# Patient Record
Sex: Male | Born: 1950 | Race: White | Hispanic: No | Smoking: Current every day smoker
Health system: Southern US, Community
[De-identification: ages and names within clinical notes are randomized; demographics above are authoritative.]

## PROBLEM LIST (undated history)

## (undated) DIAGNOSIS — R0600 Dyspnea, unspecified: Secondary | ICD-10-CM

## (undated) DIAGNOSIS — E119 Type 2 diabetes mellitus without complications: Secondary | ICD-10-CM

## (undated) DIAGNOSIS — T7840XA Allergy, unspecified, initial encounter: Secondary | ICD-10-CM

## (undated) DIAGNOSIS — C3490 Malignant neoplasm of unspecified part of unspecified bronchus or lung: Secondary | ICD-10-CM

## (undated) HISTORY — DX: Type 2 diabetes mellitus without complications: E11.9

## (undated) HISTORY — PX: COLONOSCOPY: SHX174

## (undated) HISTORY — PX: TONSILLECTOMY: SUR1361

## (undated) HISTORY — DX: Allergy, unspecified, initial encounter: T78.40XA

## (undated) HISTORY — PX: ADENOIDECTOMY: SUR15

---

## 2007-10-10 ENCOUNTER — Ambulatory Visit: Payer: Self-pay | Admitting: Gastroenterology

## 2007-11-13 ENCOUNTER — Ambulatory Visit: Payer: Self-pay | Admitting: Gastroenterology

## 2007-11-27 ENCOUNTER — Ambulatory Visit: Payer: Self-pay | Admitting: Gastroenterology

## 2009-01-23 ENCOUNTER — Inpatient Hospital Stay: Payer: Self-pay | Admitting: Internal Medicine

## 2009-03-18 ENCOUNTER — Ambulatory Visit: Payer: Self-pay | Admitting: Family Medicine

## 2013-01-01 LAB — PSA: PSA: 2

## 2015-01-06 ENCOUNTER — Ambulatory Visit: Payer: Self-pay | Admitting: Ophthalmology

## 2015-01-06 ENCOUNTER — Ambulatory Visit: Payer: Self-pay

## 2015-01-06 LAB — LIPID PANEL
CHOLESTEROL: 187 mg/dL (ref 0–200)
HDL: 33 mg/dL — AB (ref 35–70)
LDL CALC: 123 mg/dL
Triglycerides: 157 mg/dL (ref 40–160)

## 2015-01-06 LAB — CBC AND DIFFERENTIAL
HEMATOCRIT: 42 % (ref 41–53)
HEMOGLOBIN: 14.5 g/dL (ref 13.5–17.5)
NEUTROS ABS: 4 /uL
PLATELETS: 182 10*3/uL (ref 150–399)
WBC: 9.4 10*3/mL

## 2015-01-06 LAB — TSH: TSH: 7.19 u[IU]/mL — AB (ref 0.41–5.90)

## 2015-01-06 LAB — HEPATIC FUNCTION PANEL
ALK PHOS: 91 U/L (ref 25–125)
ALT: 11 U/L (ref 10–40)
AST: 18 U/L (ref 14–40)
Bilirubin, Total: 0.4 mg/dL

## 2015-01-06 LAB — BASIC METABOLIC PANEL
BUN: 9 mg/dL (ref 4–21)
CREATININE: 1 mg/dL (ref 0.6–1.3)
Glucose: 66 mg/dL
Potassium: 4.9 mmol/L (ref 3.4–5.3)
Sodium: 143 mmol/L (ref 137–147)

## 2015-01-06 LAB — HEMOGLOBIN A1C: Hemoglobin A1C: 5.8

## 2015-01-19 ENCOUNTER — Ambulatory Visit: Payer: Self-pay | Admitting: Internal Medicine

## 2015-01-19 DIAGNOSIS — E039 Hypothyroidism, unspecified: Secondary | ICD-10-CM | POA: Insufficient documentation

## 2015-01-19 DIAGNOSIS — J3089 Other allergic rhinitis: Secondary | ICD-10-CM | POA: Insufficient documentation

## 2015-03-25 DIAGNOSIS — E039 Hypothyroidism, unspecified: Secondary | ICD-10-CM

## 2015-03-25 DIAGNOSIS — J3089 Other allergic rhinitis: Secondary | ICD-10-CM

## 2015-03-25 DIAGNOSIS — E119 Type 2 diabetes mellitus without complications: Secondary | ICD-10-CM

## 2015-03-30 ENCOUNTER — Ambulatory Visit: Payer: Self-pay

## 2015-03-30 DIAGNOSIS — E039 Hypothyroidism, unspecified: Secondary | ICD-10-CM

## 2015-03-30 DIAGNOSIS — I10 Essential (primary) hypertension: Secondary | ICD-10-CM

## 2015-03-31 ENCOUNTER — Other Ambulatory Visit: Payer: Self-pay

## 2015-03-31 LAB — COMPREHENSIVE METABOLIC PANEL
ALBUMIN: 4.6 g/dL (ref 3.6–4.8)
ALK PHOS: 92 IU/L (ref 39–117)
ALT: 9 IU/L (ref 0–44)
AST: 16 IU/L (ref 0–40)
Albumin/Globulin Ratio: 1.6 (ref 1.2–2.2)
BILIRUBIN TOTAL: 0.2 mg/dL (ref 0.0–1.2)
BUN/Creatinine Ratio: 11 (ref 10–22)
BUN: 12 mg/dL (ref 8–27)
CHLORIDE: 102 mmol/L (ref 96–106)
CO2: 21 mmol/L (ref 18–29)
Calcium: 9.8 mg/dL (ref 8.6–10.2)
Creatinine, Ser: 1.13 mg/dL (ref 0.76–1.27)
GFR calc Af Amer: 79 mL/min/{1.73_m2} (ref >59)
GFR calc non Af Amer: 68 mL/min/{1.73_m2} (ref >59)
GLOBULIN, TOTAL: 2.9 g/dL (ref 1.5–4.5)
GLUCOSE: 122 mg/dL — AB (ref 65–99)
Potassium: 6 mmol/L — ABNORMAL HIGH (ref 3.5–5.2)
Sodium: 146 mmol/L — ABNORMAL HIGH (ref 134–144)
Total Protein: 7.5 g/dL (ref 6.0–8.5)

## 2015-03-31 LAB — CBC WITH DIFFERENTIAL/PLATELET
BASOS ABS: 0 10*3/uL (ref 0.0–0.2)
Basos: 0 %
EOS (ABSOLUTE): 0.2 10*3/uL (ref 0.0–0.4)
Eos: 2 %
HEMOGLOBIN: 15.2 g/dL (ref 12.6–17.7)
Hematocrit: 45.5 % (ref 37.5–51.0)
IMMATURE GRANS (ABS): 0 10*3/uL (ref 0.0–0.1)
Immature Granulocytes: 0 %
LYMPHS: 19 %
Lymphocytes Absolute: 2.4 10*3/uL (ref 0.7–3.1)
MCH: 30.6 pg (ref 26.6–33.0)
MCHC: 33.4 g/dL (ref 31.5–35.7)
MCV: 92 fL (ref 79–97)
MONOCYTES: 8 %
Monocytes Absolute: 1 10*3/uL — ABNORMAL HIGH (ref 0.1–0.9)
Neutrophils Absolute: 9.2 10*3/uL — ABNORMAL HIGH (ref 1.4–7.0)
Neutrophils: 71 %
PLATELETS: 221 10*3/uL (ref 150–379)
RBC: 4.97 x10E6/uL (ref 4.14–5.80)
RDW: 13.8 % (ref 12.3–15.4)
WBC: 12.9 10*3/uL — AB (ref 3.4–10.8)

## 2015-03-31 LAB — T4: T4 TOTAL: 8.7 ug/dL (ref 4.5–12.0)

## 2015-03-31 LAB — HEMOGLOBIN A1C
Est. average glucose Bld gHb Est-mCnc: 126 mg/dL
HEMOGLOBIN A1C: 6 % — AB (ref 4.8–5.6)

## 2015-04-13 ENCOUNTER — Ambulatory Visit: Payer: Self-pay | Admitting: Internal Medicine

## 2015-04-13 ENCOUNTER — Encounter: Payer: Self-pay | Admitting: Internal Medicine

## 2015-04-13 VITALS — BP 109/70 | HR 66 | Temp 97.5°F | Wt 190.0 lb

## 2015-04-13 DIAGNOSIS — E119 Type 2 diabetes mellitus without complications: Secondary | ICD-10-CM

## 2015-04-13 LAB — GLUCOSE, POCT (MANUAL RESULT ENTRY): POC GLUCOSE: 95 mg/dL (ref 70–99)

## 2015-04-13 NOTE — Progress Notes (Signed)
   Subjective:    Patient ID: Austin Bryan, male    DOB: 07-20-1950, 65 y.o.   MRN: [id number]  HPI Patient Active Problem List   Diagnosis Date Noted  . Hypothyroidism 01/19/2015  . 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 Diabetes ([place]) 02/10/2009   Pt presents with a f/u for diabetes. His blood glucose this morning was 95.   Pt is having congestion and coughing due to allergies. Pt has been taking a few tablets of Claritin and it seemed to help. Pt is on Allegra.   Pt has had some acid reflux but claims that it is rare. Pt claims that he was prescribed Ranitidine with Metformin to counteract stomach problems. Pt has about 3-4 bowel movements a day (depends on what he's eaten). Pt doesn't have any indigestion problems.    Review of Systems  HENT: Positive for congestion.   Respiratory: Positive for cough.        Objective:   Physical Exam  Constitutional: He is oriented to person, place, and time.  Cardiovascular: Normal rate, regular rhythm and normal heart sounds.   Pulmonary/Chest: Effort normal and breath sounds normal.  Neurological: He is alert and oriented to person, place, and time.    BP 109/70 mmHg  Pulse 66  Temp(Src) 97.5 F (36.4 C)  Wt 190 lb (86.183 kg)  Blood glucose: 95    Medication List       This list is accurate as of: 04/13/15 11:34 AM.  Always use your most recent med list.               fexofenadine 180 MG tablet  Commonly known as:  ALLEGRA  Take 180 mg by mouth daily.     metFORMIN 500 MG tablet  Commonly known as:  GLUCOPHAGE  Take 500 mg by mouth 2 (two) times daily with a meal.     ranitidine 150 MG tablet  Commonly known as:  ZANTAC  Take 150 mg by mouth daily.          Assessment & Plan:  Discontinue Ranitidine. If GERD returns, needs to go back on this med.   Diabetes ([name]) Diabetes looks good. Glucose this morning was 95 and last A1c was 6.0. No change in meds.    Md f/u: 4 months Labs before: Met c, cbc, a1c,  vitamin B12, tsh, lipid

## 2015-04-13 NOTE — Assessment & Plan Note (Addendum)
Diabetes looks good. Glucose this morning was 95 and last A1c was 6.0. No change in meds.

## 2015-08-10 ENCOUNTER — Other Ambulatory Visit: Payer: Self-pay

## 2015-08-17 ENCOUNTER — Ambulatory Visit: Payer: Self-pay | Admitting: Internal Medicine

## 2015-08-17 ENCOUNTER — Other Ambulatory Visit: Payer: Self-pay

## 2015-08-17 DIAGNOSIS — E119 Type 2 diabetes mellitus without complications: Secondary | ICD-10-CM

## 2015-08-17 DIAGNOSIS — E039 Hypothyroidism, unspecified: Secondary | ICD-10-CM

## 2015-08-17 DIAGNOSIS — I10 Essential (primary) hypertension: Secondary | ICD-10-CM

## 2015-08-18 LAB — CBC WITH DIFFERENTIAL/PLATELET
BASOS ABS: 0.1 10*3/uL (ref 0.0–0.2)
Basos: 1 %
EOS (ABSOLUTE): 0.4 10*3/uL (ref 0.0–0.4)
Eos: 4 %
Hematocrit: 44 % (ref 37.5–51.0)
Hemoglobin: 14.7 g/dL (ref 12.6–17.7)
IMMATURE GRANS (ABS): 0 10*3/uL (ref 0.0–0.1)
Immature Granulocytes: 0 %
LYMPHS: 27 %
Lymphocytes Absolute: 2.6 10*3/uL (ref 0.7–3.1)
MCH: 30.8 pg (ref 26.6–33.0)
MCHC: 33.4 g/dL (ref 31.5–35.7)
MCV: 92 fL (ref 79–97)
Monocytes Absolute: 0.8 10*3/uL (ref 0.1–0.9)
Monocytes: 8 %
NEUTROS ABS: 5.9 10*3/uL (ref 1.4–7.0)
NEUTROS PCT: 60 %
PLATELETS: 195 10*3/uL (ref 150–379)
RBC: 4.77 x10E6/uL (ref 4.14–5.80)
RDW: 14.6 % (ref 12.3–15.4)
WBC: 9.7 10*3/uL (ref 3.4–10.8)

## 2015-08-18 LAB — COMPREHENSIVE METABOLIC PANEL
A/G RATIO: 1.7 (ref 1.2–2.2)
ALK PHOS: 84 IU/L (ref 39–117)
ALT: 12 IU/L (ref 0–44)
AST: 15 IU/L (ref 0–40)
Albumin: 4.3 g/dL (ref 3.6–4.8)
BILIRUBIN TOTAL: 0.2 mg/dL (ref 0.0–1.2)
BUN/Creatinine Ratio: 12 (ref 10–24)
BUN: 12 mg/dL (ref 8–27)
CHLORIDE: 100 mmol/L (ref 96–106)
CO2: 26 mmol/L (ref 18–29)
Calcium: 9.2 mg/dL (ref 8.6–10.2)
Creatinine, Ser: 0.98 mg/dL (ref 0.76–1.27)
GFR calc Af Amer: 94 mL/min/{1.73_m2} (ref >59)
GFR calc non Af Amer: 81 mL/min/{1.73_m2} (ref >59)
GLUCOSE: 82 mg/dL (ref 65–99)
Globulin, Total: 2.5 g/dL (ref 1.5–4.5)
POTASSIUM: 4.8 mmol/L (ref 3.5–5.2)
Sodium: 141 mmol/L (ref 134–144)
Total Protein: 6.8 g/dL (ref 6.0–8.5)

## 2015-08-18 LAB — LIPID PANEL
CHOL/HDL RATIO: 5 ratio (ref 0.0–5.0)
CHOLESTEROL TOTAL: 154 mg/dL (ref 100–199)
HDL: 31 mg/dL — ABNORMAL LOW (ref >39)
LDL CALC: 98 mg/dL (ref 0–99)
Triglycerides: 126 mg/dL (ref 0–149)
VLDL Cholesterol Cal: 25 mg/dL (ref 5–40)

## 2015-08-18 LAB — VITAMIN B12: Vitamin B-12: 203 pg/mL — ABNORMAL LOW (ref 211–946)

## 2015-08-18 LAB — HEMOGLOBIN A1C
Est. average glucose Bld gHb Est-mCnc: 114 mg/dL
HEMOGLOBIN A1C: 5.6 % (ref 4.8–5.6)

## 2015-08-18 LAB — TSH: TSH: 8.35 u[IU]/mL — AB (ref 0.450–4.500)

## 2015-08-24 ENCOUNTER — Ambulatory Visit: Payer: Self-pay | Admitting: Internal Medicine

## 2015-09-14 ENCOUNTER — Encounter: Payer: Self-pay | Admitting: Internal Medicine

## 2015-09-14 ENCOUNTER — Ambulatory Visit: Payer: Self-pay | Admitting: Internal Medicine

## 2015-09-14 VITALS — BP 117/68 | HR 82 | Temp 98.2°F | Wt 186.0 lb

## 2015-09-14 DIAGNOSIS — E119 Type 2 diabetes mellitus without complications: Secondary | ICD-10-CM

## 2015-09-14 DIAGNOSIS — R7989 Other specified abnormal findings of blood chemistry: Secondary | ICD-10-CM

## 2015-09-14 DIAGNOSIS — R490 Dysphonia: Secondary | ICD-10-CM

## 2015-09-14 LAB — GLUCOSE, POCT (MANUAL RESULT ENTRY): POC Glucose: 131 mg/dl — AB (ref 70–99)

## 2015-09-14 MED ORDER — RANITIDINE HCL 150 MG PO TABS: 150.0000 mg | ORAL_TABLET | Freq: Two times a day (BID) | ORAL | 2 refills | Status: DC

## 2015-09-14 MED ORDER — CYANOCOBALAMIN 1000 MCG/ML IJ SOLN: 1000.0000 ug | Freq: Once | INTRAMUSCULAR | 0 refills | Status: AC

## 2015-09-14 MED ORDER — METFORMIN HCL 500 MG PO TABS: 500.0000 mg | ORAL_TABLET | Freq: Two times a day (BID) | ORAL | 2 refills | Status: DC

## 2015-09-14 MED ORDER — FEXOFENADINE HCL 180 MG PO TABS: 180.0000 mg | ORAL_TABLET | Freq: Every day | ORAL | 2 refills | Status: DC

## 2015-09-14 NOTE — Progress Notes (Signed)
   Subjective:    Patient ID: Austin Bryan, male    DOB: Apr 09, 1950, 65 y.o.   MRN: [id number]  HPI  Patient Active Problem List   Diagnosis Date Noted  . Hypothyroidism 01/19/2015  . 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 Diabetes ([place]) 02/10/2009   Blood glucose is good.  Patient has lost 10 pounds.  He is hoarse and has been for few days.  B12 level is low which could be due to Metformin.  Blood pressure is normal  Review of Systems No goiter felt in neck.  Lungs sounds normal    Objective:   Physical Exam  Constitutional: He is oriented to person, place, and time.  Cardiovascular: Normal rate, regular rhythm and normal heart sounds.   Pulmonary/Chest: Effort normal and breath sounds normal.  Neurological: He is alert and oriented to person, place, and time. He has normal reflexes.    BP 117/68   Pulse 82   Temp 98.2 F (36.8 C)   Wt 186 lb (84.4 kg)    Current Outpatient Prescriptions on File Prior to Visit  Medication Sig Dispense Refill  . fexofenadine (ALLEGRA) 180 MG tablet Take 180 mg by mouth daily.    . metFORMIN (GLUCOPHAGE) 500 MG tablet Take 500 mg by mouth 2 (two) times daily with a meal.     No current facility-administered medications on file prior to visit.          Assessment & Plan:  Patient needs chest x-ray due to being a smoker and being hoase.  Ultrasound of the thyroid due to TSH level increasing and being hoarse. After blood tests completed.  1cc B12 to be administered today.  Follow up 3 months with labs: G54 level, Folic Acid, MetC, CBC, Lipid, A1c, TSH, UA, microalbumin.

## 2015-09-14 NOTE — Patient Instructions (Signed)
Labs in 3 months Y57 level, Folic Acid, MetC, CBC, Lipid, A1c, TSH, UA, microalbumin. Needs chest x-ray and ultrasound of thyroid due to hoarseness, smoker and rising TSH

## 2015-09-15 LAB — VITAMIN B12: VITAMIN B 12: 191 pg/mL — AB (ref 211–946)

## 2015-09-27 ENCOUNTER — Ambulatory Visit
Admission: RE | Admit: 2015-09-27 | Discharge: 2015-09-27 | Disposition: A | Discharge status: Home / Self Care | Payer: Medicare Other | Source: Ambulatory Visit | Attending: Internal Medicine | Admitting: Internal Medicine

## 2015-09-27 DIAGNOSIS — R7989 Other specified abnormal findings of blood chemistry: Secondary | ICD-10-CM

## 2015-09-27 DIAGNOSIS — R490 Dysphonia: Secondary | ICD-10-CM | POA: Insufficient documentation

## 2015-09-27 DIAGNOSIS — R938 Abnormal findings on diagnostic imaging of other specified body structures: Secondary | ICD-10-CM | POA: Insufficient documentation

## 2015-09-27 DIAGNOSIS — R946 Abnormal results of thyroid function studies: Secondary | ICD-10-CM | POA: Diagnosis not present

## 2015-11-07 ENCOUNTER — Ambulatory Visit: Payer: Medicare Other | Admitting: Pharmacist

## 2015-11-07 ENCOUNTER — Ambulatory Visit: Payer: Medicare Other | Admitting: Pharmacy Technician

## 2015-11-07 DIAGNOSIS — Z79899 Other long term (current) drug therapy: Secondary | ICD-10-CM

## 2015-11-08 NOTE — Progress Notes (Signed)
  Patient is new to Medicare.  Medicare Part A & B effective September 16, 2015.  Patient's gross monthly amount received from social security is $1,100.  With patient's consent, completed online application for medicare savings program and Low Income Subsidy.  Patient's resources exceed the limits to qualify for either of these programs.  Patient to meet with Ellie Lunch regarding Medicare Part D consult.  Patient understood that he must sign-up for a Medicare Part D plan right away.  Also, patient in need of a primary care provider.  Provided patient with phone number for [facility] and Physician Referral line.  Also, provided patient with other community resources pertaining to his particular needs.    [facility]

## 2015-11-09 ENCOUNTER — Telehealth: Payer: Self-pay

## 2015-11-09 NOTE — Telephone Encounter (Signed)
We received word that pt has now has medicare as of Sept 1, 2017. Tried calling to tell pt that he is no longer eligible to be seen at this clinic. Phone did not have a vmail set up. Once pt is notified the appts will need to be cancelled.

## 2015-11-22 NOTE — Progress Notes (Signed)
Austin Bryan was here 11/07/15 to review Medicare Part D/Advantage plans.  Patient's current medication list was reviewed and all of his medications can be obtained through a Medicare Part D/Advantage plan. Patient will be eligible to sign up for a Medicare Part D/Advantage plan on 01/16/2016. Medicare.gov was used to review the available Part D/Advantage plans. Patient qualifies for the following subsidies: not applicable due to income. Patient has chosen the [employer]). This plan has a zero monthly premium and a $100 annual medication deductible. However, the patient is on 3 medications that should be covered at the level of Tier 1 and Tier 2, which are not applicable to the annual medication deductible. The patient signed a waiver to allow me to enroll him with this plan. He does not have access to a computer and asked for assistance with the enrollment. Patient was given a copy of the enrollment code and application for the [facility] plan.  Prior to Admission medications   Medication Sig Start Date End Date Taking? Authorizing Provider  fexofenadine (ALLEGRA) 180 MG tablet Take 1 tablet (180 mg total) by mouth daily. 09/14/15   Tawni Millers, MD  metFORMIN (GLUCOPHAGE) 500 MG tablet Take 1 tablet (500 mg total) by mouth 2 (two) times daily with a meal. 09/14/15   Tawni Millers, MD  ranitidine (ZANTAC) 150 MG tablet Take 1 tablet (150 mg total) by mouth 2 (two) times daily. 09/14/15   Tawni Millers, MD   Darrik Richman K. Dicky Doe, PharmD [facility] [place] Operations Coordinator [phone]

## 2015-12-21 ENCOUNTER — Other Ambulatory Visit: Payer: Self-pay

## 2015-12-28 ENCOUNTER — Ambulatory Visit: Payer: Self-pay | Admitting: Internal Medicine

## 2016-01-05 ENCOUNTER — Ambulatory Visit: Payer: Self-pay | Admitting: Ophthalmology

## 2016-03-15 DIAGNOSIS — C3490 Malignant neoplasm of unspecified part of unspecified bronchus or lung: Secondary | ICD-10-CM

## 2016-03-15 HISTORY — DX: Malignant neoplasm of unspecified part of unspecified bronchus or lung: C34.90

## 2016-04-02 ENCOUNTER — Ambulatory Visit
Admission: RE | Admit: 2016-04-02 | Discharge: 2016-04-02 | Disposition: A | Discharge status: Home / Self Care | Payer: Medicare HMO | Source: Ambulatory Visit | Attending: Otolaryngology | Admitting: Otolaryngology

## 2016-04-02 ENCOUNTER — Other Ambulatory Visit: Payer: Self-pay | Admitting: Otolaryngology

## 2016-04-02 DIAGNOSIS — R590 Localized enlarged lymph nodes: Secondary | ICD-10-CM | POA: Diagnosis not present

## 2016-04-02 DIAGNOSIS — J3800 Paralysis of vocal cords and larynx, unspecified: Secondary | ICD-10-CM | POA: Diagnosis present

## 2016-04-02 DIAGNOSIS — R911 Solitary pulmonary nodule: Secondary | ICD-10-CM | POA: Diagnosis not present

## 2016-04-02 DIAGNOSIS — M899 Disorder of bone, unspecified: Secondary | ICD-10-CM | POA: Diagnosis not present

## 2016-04-02 LAB — POCT I-STAT CREATININE: CREATININE: 0.9 mg/dL (ref 0.61–1.24)

## 2016-04-02 MED ORDER — IOPAMIDOL (ISOVUE-300) INJECTION 61%
75.0000 mL | Freq: Once | INTRAVENOUS | Status: AC | PRN
  Administered 2016-04-02: 75 mL via INTRAVENOUS

## 2016-04-03 ENCOUNTER — Other Ambulatory Visit: Payer: Self-pay | Admitting: Otolaryngology

## 2016-04-03 DIAGNOSIS — R918 Other nonspecific abnormal finding of lung field: Secondary | ICD-10-CM

## 2016-04-05 ENCOUNTER — Telehealth: Payer: Self-pay

## 2016-04-05 NOTE — Telephone Encounter (Signed)
Called to schedule NP appt based on Ref'.  No VM .  Called home # and sister stated he just left for work and I would have to call back at a later time.

## 2016-04-06 ENCOUNTER — Encounter: Payer: Self-pay | Admitting: *Deleted

## 2016-04-06 ENCOUNTER — Other Ambulatory Visit: Payer: Self-pay | Admitting: *Deleted

## 2016-04-06 DIAGNOSIS — R918 Other nonspecific abnormal finding of lung field: Secondary | ICD-10-CM

## 2016-04-06 NOTE — Progress Notes (Signed)
  Oncology Nurse Navigator Documentation  Navigator Location: [facility] (04/06/16 1600)   )Navigator Encounter Type: Telephone (04/06/16 1600)                         Barriers/Navigation Needs: Coordination of Care;No barriers at this time;No Questions;No Needs (04/06/16 1600)   Interventions: Coordination of Care (04/06/16 1600)    referral received from [facility] ENT for lung mass, lymphadenopathy. Chart reviewed. Pt is appropriate to be scheduled for lung MDC on 04/13/16. Pt scheduled to see Dr. Janese Banks on 04/13/16 at 8:30am. Pt was informed of appt.         Acuity: Level 2 (04/06/16 1600)   Acuity Level 2: Initial guidance, education and coordination as needed;Assistance expediting appointments (04/06/16 1600)     Time Spent with Patient: 30 (04/06/16 1600)

## 2016-04-10 ENCOUNTER — Ambulatory Visit
Admission: RE | Admit: 2016-04-10 | Discharge: 2016-04-10 | Disposition: A | Discharge status: Home / Self Care | Payer: Medicare HMO | Source: Ambulatory Visit | Attending: Otolaryngology | Admitting: Otolaryngology

## 2016-04-10 DIAGNOSIS — I700 Atherosclerosis of aorta: Secondary | ICD-10-CM | POA: Diagnosis not present

## 2016-04-10 DIAGNOSIS — K8020 Calculus of gallbladder without cholecystitis without obstruction: Secondary | ICD-10-CM | POA: Diagnosis not present

## 2016-04-10 DIAGNOSIS — R918 Other nonspecific abnormal finding of lung field: Secondary | ICD-10-CM

## 2016-04-10 DIAGNOSIS — I2510 Atherosclerotic heart disease of native coronary artery without angina pectoris: Secondary | ICD-10-CM | POA: Insufficient documentation

## 2016-04-10 DIAGNOSIS — R590 Localized enlarged lymph nodes: Secondary | ICD-10-CM | POA: Diagnosis not present

## 2016-04-10 DIAGNOSIS — C787 Secondary malignant neoplasm of liver and intrahepatic bile duct: Secondary | ICD-10-CM | POA: Insufficient documentation

## 2016-04-10 LAB — GLUCOSE, CAPILLARY: Glucose-Capillary: 78 mg/dL (ref 65–99)

## 2016-04-10 MED ORDER — FLUDEOXYGLUCOSE F - 18 (FDG) INJECTION
13.0200 | Freq: Once | INTRAVENOUS | Status: AC | PRN
  Administered 2016-04-10: 13.02 via INTRAVENOUS

## 2016-04-11 ENCOUNTER — Other Ambulatory Visit: Payer: Self-pay | Admitting: Otolaryngology

## 2016-04-13 ENCOUNTER — Telehealth: Payer: Self-pay | Admitting: *Deleted

## 2016-04-13 ENCOUNTER — Inpatient Hospital Stay: Payer: Medicare HMO | Attending: Oncology | Admitting: Oncology

## 2016-04-13 ENCOUNTER — Encounter: Payer: Self-pay | Admitting: Oncology

## 2016-04-13 ENCOUNTER — Encounter: Payer: Self-pay | Admitting: *Deleted

## 2016-04-13 ENCOUNTER — Ambulatory Visit: Payer: Medicare HMO | Admitting: Oncology

## 2016-04-13 ENCOUNTER — Inpatient Hospital Stay: Payer: Medicare HMO

## 2016-04-13 ENCOUNTER — Other Ambulatory Visit: Payer: Self-pay | Admitting: *Deleted

## 2016-04-13 ENCOUNTER — Other Ambulatory Visit: Payer: Self-pay | Admitting: Radiology

## 2016-04-13 ENCOUNTER — Other Ambulatory Visit: Payer: Self-pay | Admitting: Oncology

## 2016-04-13 VITALS — BP 112/76 | HR 79 | Temp 96.2°F | Resp 20 | Ht 74.02 in | Wt 169.1 lb

## 2016-04-13 DIAGNOSIS — C7951 Secondary malignant neoplasm of bone: Secondary | ICD-10-CM

## 2016-04-13 DIAGNOSIS — C778 Secondary and unspecified malignant neoplasm of lymph nodes of multiple regions: Secondary | ICD-10-CM

## 2016-04-13 DIAGNOSIS — R531 Weakness: Secondary | ICD-10-CM | POA: Insufficient documentation

## 2016-04-13 DIAGNOSIS — K8020 Calculus of gallbladder without cholecystitis without obstruction: Secondary | ICD-10-CM | POA: Insufficient documentation

## 2016-04-13 DIAGNOSIS — R5383 Other fatigue: Secondary | ICD-10-CM | POA: Insufficient documentation

## 2016-04-13 DIAGNOSIS — M899 Disorder of bone, unspecified: Secondary | ICD-10-CM | POA: Diagnosis not present

## 2016-04-13 DIAGNOSIS — R918 Other nonspecific abnormal finding of lung field: Secondary | ICD-10-CM

## 2016-04-13 DIAGNOSIS — J3801 Paralysis of vocal cords and larynx, unilateral: Secondary | ICD-10-CM | POA: Diagnosis not present

## 2016-04-13 DIAGNOSIS — I700 Atherosclerosis of aorta: Secondary | ICD-10-CM | POA: Insufficient documentation

## 2016-04-13 DIAGNOSIS — F17210 Nicotine dependence, cigarettes, uncomplicated: Secondary | ICD-10-CM | POA: Diagnosis not present

## 2016-04-13 DIAGNOSIS — Z79899 Other long term (current) drug therapy: Secondary | ICD-10-CM | POA: Diagnosis not present

## 2016-04-13 DIAGNOSIS — Z7984 Long term (current) use of oral hypoglycemic drugs: Secondary | ICD-10-CM | POA: Diagnosis not present

## 2016-04-13 DIAGNOSIS — C787 Secondary malignant neoplasm of liver and intrahepatic bile duct: Secondary | ICD-10-CM

## 2016-04-13 DIAGNOSIS — R591 Generalized enlarged lymph nodes: Secondary | ICD-10-CM | POA: Diagnosis not present

## 2016-04-13 DIAGNOSIS — R490 Dysphonia: Secondary | ICD-10-CM | POA: Insufficient documentation

## 2016-04-13 DIAGNOSIS — R634 Abnormal weight loss: Secondary | ICD-10-CM | POA: Insufficient documentation

## 2016-04-13 DIAGNOSIS — R9389 Abnormal findings on diagnostic imaging of other specified body structures: Secondary | ICD-10-CM

## 2016-04-13 DIAGNOSIS — I2510 Atherosclerotic heart disease of native coronary artery without angina pectoris: Secondary | ICD-10-CM

## 2016-04-13 DIAGNOSIS — R948 Abnormal results of function studies of other organs and systems: Secondary | ICD-10-CM

## 2016-04-13 DIAGNOSIS — E119 Type 2 diabetes mellitus without complications: Secondary | ICD-10-CM | POA: Diagnosis not present

## 2016-04-13 LAB — CBC WITH DIFFERENTIAL/PLATELET
BASOS ABS: 0.1 10*3/uL (ref 0–0.1)
BASOS PCT: 1 %
EOS ABS: 0.1 10*3/uL (ref 0–0.7)
EOS PCT: 2 %
HCT: 40.9 % (ref 40.0–52.0)
HEMOGLOBIN: 14 g/dL (ref 13.0–18.0)
LYMPHS ABS: 1.4 10*3/uL (ref 1.0–3.6)
Lymphocytes Relative: 19 %
MCH: 31.3 pg (ref 26.0–34.0)
MCHC: 34.2 g/dL (ref 32.0–36.0)
MCV: 91.6 fL (ref 80.0–100.0)
Monocytes Absolute: 0.8 10*3/uL (ref 0.2–1.0)
Monocytes Relative: 11 %
Neutro Abs: 5.1 10*3/uL (ref 1.4–6.5)
Neutrophils Relative %: 67 %
PLATELETS: 181 10*3/uL (ref 150–440)
RBC: 4.46 MIL/uL (ref 4.40–5.90)
RDW: 13.9 % (ref 11.5–14.5)
WBC: 7.5 10*3/uL (ref 3.8–10.6)

## 2016-04-13 LAB — COMPREHENSIVE METABOLIC PANEL
ALBUMIN: 3.9 g/dL (ref 3.5–5.0)
ALK PHOS: 97 U/L (ref 38–126)
ALT: 34 U/L (ref 17–63)
AST: 36 U/L (ref 15–41)
Anion gap: 7 (ref 5–15)
BUN: 16 mg/dL (ref 6–20)
CALCIUM: 9.2 mg/dL (ref 8.9–10.3)
CHLORIDE: 100 mmol/L — AB (ref 101–111)
CO2: 28 mmol/L (ref 22–32)
CREATININE: 0.91 mg/dL (ref 0.61–1.24)
GFR calc non Af Amer: >60 mL/min (ref >60)
GLUCOSE: 93 mg/dL (ref 65–99)
Potassium: 4.2 mmol/L (ref 3.5–5.1)
SODIUM: 135 mmol/L (ref 135–145)
Total Bilirubin: 0.8 mg/dL (ref 0.3–1.2)
Total Protein: 7.5 g/dL (ref 6.5–8.1)

## 2016-04-13 LAB — PROTIME-INR
INR: 1.05
Prothrombin Time: 13.7 seconds (ref 11.4–15.2)

## 2016-04-13 MED ORDER — MORPHINE SULFATE (CONCENTRATE) 10 MG /0.5 ML PO SOLN: 5.0000 mg | ORAL | 0 refills | Status: DC | PRN

## 2016-04-13 MED ORDER — MORPHINE SULFATE (CONCENTRATE) 10 MG /0.5 ML PO SOLN: 5.0000 mg | ORAL | 0 refills | Status: AC | PRN

## 2016-04-13 NOTE — Telephone Encounter (Signed)
Informed pt of biopsy scheduled for Monday 4/2 at 11am, pt instructed to arrive at 10am in the medical mall and to register at adm desk. Pt advised to be NPO after midnight the night before his biopsy, may take meds with small sip of water, and to have driver available to take him home. Informed pt that will have port placed same day right after biopsy as well. Pt scheduled to follow up with Dr. Janese Banks on 4/5 at 1:45 for biopsy results. Pt verbalized understanding and stated will have daughter bring him to all his appointments.

## 2016-04-13 NOTE — Progress Notes (Signed)
  Oncology Nurse Navigator Documentation  Navigator Location: [facility] (04/13/16 1200) Referral date to RadOnc/MedOnc: 04/13/16 (04/13/16 1200) )Navigator Encounter Type: Clinic/MDC (04/13/16 1200)   Abnormal Finding Date: 04/02/16 (04/13/16 1200)           Multidisiplinary Clinic Date: 04/13/16 (04/13/16 1200) Multidisiplinary Clinic Type: Thoracic (04/13/16 1200)       Barriers/Navigation Needs: No barriers at this time;No Needs (04/13/16 1200)   Interventions: Coordination of Care (04/13/16 1200)   Coordination of Care: Appts (04/13/16 1200)     Pt seen in [place] with Dr. Janese Banks for lung mass. All questions and concerns addressed during visit. Pt to be scheduled for STAT liver biopsy coordinated with port placement in interventional radiology. Informed pt once scheduled, our office would notify the patient his appt. Pt verbalized understanding.   Acuity: Level 2 (04/13/16 1200)   Acuity Level 2: Initial guidance, education and coordination as needed;Assistance expediting appointments (04/13/16 1200)     Time Spent with Patient: 60 (04/13/16 1200)

## 2016-04-13 NOTE — Addendum Note (Signed)
Addended by: Telford Nab on: 04/13/2016 11:40 AM   Modules accepted: Orders

## 2016-04-13 NOTE — Progress Notes (Signed)
Hematology/Oncology Consult note [facility] Telephone:([phone] [phone]  Patient Care Team: Gennette Pac, FNP as PCP - General (Family Medicine)   Name of the patient: Austin Bryan  [id number]  02/18/1950    Reason for referral- lung mass/ lymphadenopathy  Referring physician- Dr. Pryor Ochoa     Date of visit: 04/13/16   History of presenting illness- 1. Patient is a 66 year old male who was seen by Dr. walk from ENT for symptoms of hoarseness of voice for 1 month. 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 Patient is a chronic smoker and has smoked about 1-1/2 packs per day for more than 30 years. He has lost about 17 pounds of weight over the last 6-7 months. Patient lives with his sister and is independent of his ADLs and IADLs.  2. CT soft tissue of the neck on 04/02/2016 showed: 1. Soft tissue mass along the undersurface of the aortic arch is most concerning for a metastatic adenopathy. This likely impacts the left recurrent laryngeal nerve leading to vocal cord paralysis. 2. Extensive mediastinal adenopathy compatible with metastatic disease. 3. Numerous lytic lesions throughout the visualized is cervical and thoracic spine as well is bilateral ribs. These are compatible with additional metastases. 4. 12 mm pleural-based nodule in the left upper lid may represent a primary bronchogenic neoplasm. This is more likely metastatic disease. 5. Findings are most concerning for a primary lung cancer. Recommend CT of the chest, abdomen, and pelvis with contrast for further evaluation of the primary neoplasm and extent of metastatic disease.  3. PET CT scan on 04/10/2016 showed:IMPRESSION:1. Extensive lytic malignancy in the skeleton with heavy burden of hepatic metastatic lesions, adenopathy in the chest, lower neck, and likely porta hepatis compatible with malignancy ; and a small left upper lobe pulmonary nodule which is likewise  hypermetabolic. The bony disease resembles myeloma but it would be unusual to have this much hepatic and soft tissue involvement in the setting of myeloma. Other possibilities might include an unusually aggressive central lung cancer. Tissue diagnosis recommended. 2. AP window mass is indistinct and may be interfering with the recurrent laryngeal nerve. The asymmetric appearance of the cords is probably secondary to this interference. 3. 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 Cholelithiasis.  ECOG PS- 1  Pain scale- 3   Review of systems- Review of Systems  Constitutional: Positive for malaise/fatigue and weight loss. Negative for chills and fever.  HENT: Negative for congestion, ear discharge and nosebleeds.        Hoarseness of voice +  Eyes: Negative for blurred vision.  Respiratory: Negative for cough, hemoptysis, sputum production, shortness of breath and wheezing.   Cardiovascular: Negative for chest pain, palpitations, orthopnea and claudication.  Gastrointestinal: Negative for abdominal pain, blood in stool, constipation, diarrhea, heartburn, melena, nausea and vomiting.  Genitourinary: Negative for dysuria, flank pain, frequency, hematuria and urgency.  Musculoskeletal: Negative for back pain, joint pain and myalgias.  Skin: Negative for rash.  Neurological: Positive for weakness. Negative for dizziness, tingling, focal weakness, seizures and headaches.  Endo/Heme/Allergies: Does not bruise/bleed easily.  Psychiatric/Behavioral: Negative for depression and suicidal ideas. The patient does not have insomnia.     No Known Allergies  Patient Active Problem List   Diagnosis Date Noted  . 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 Diabetes ([place]) 02/10/2009     Past Medical History:  Diagnosis Date  . Diabetes mellitus without  complication ([place])      No  past surgical history on file.  Social History   Social History  . Marital status: Divorced    Spouse name: N/A  . Number of children: N/A  . Years of education: N/A   Occupational History  . Not on file.   Social History Main Topics  . Smoking status: Current Every Day Smoker    Packs/day: 1.00    Years: 40.00    Types: Cigarettes  . Smokeless tobacco: Current User  . Alcohol use No  . Drug use: No  . Sexual activity: Not on file   Other Topics Concern  . Not on file   Social History Narrative  . No narrative on file     Family History  Problem Relation Age of Onset  . Cancer Mother   . Cancer Father      Current Outpatient Prescriptions:  .  fluticasone (FLONASE) 50 MCG/ACT nasal spray, Place 2 sprays into both nostrils daily., Disp: , Rfl:  .  fexofenadine (ALLEGRA) 180 MG tablet, Take 1 tablet (180 mg total) by mouth daily., Disp: 90 tablet, Rfl: 2 .  metFORMIN (GLUCOPHAGE) 500 MG tablet, Take 1 tablet (500 mg total) by mouth 2 (two) times daily with a meal., Disp: 180 tablet, Rfl: 2 .  Morphine Sulfate (MORPHINE CONCENTRATE) 10 mg / 0.5 ml concentrated solution, Take 0.25 mLs (5 mg total) by mouth every 4 (four) hours as needed for severe pain., Disp: 30 mL, Rfl: 0 .  ranitidine (ZANTAC) 150 MG tablet, Take 1 tablet (150 mg total) by mouth 2 (two) times daily., Disp: 180 tablet, Rfl: 2   Physical exam:  Vitals:   04/13/16 0845  BP: 112/76  Pulse: 79  Resp: 20  Temp: (!) 96.2 F (35.7 C)  TempSrc: Tympanic  Weight: 169 lb 1.6 oz (76.7 kg)  Height: 6' 2.02" (1.88 m)   Physical Exam  Constitutional: He is oriented to person, place, and time.  Patient is thin and appears in no acute distress  HENT:  Head: Normocephalic and atraumatic.  Eyes: EOM are normal. Pupils are equal, round, and reactive to light.  Neck: Normal range of motion.  Cardiovascular: Normal rate, regular rhythm and normal heart sounds.   Pulmonary/Chest: Effort normal and breath  sounds normal.  Abdominal: Soft. Bowel sounds are normal.  Lymphadenopathy:  No palpable adenopathy  Neurological: He is alert and oriented to person, place, and time.  Skin: Skin is warm and dry.       CMP Latest Ref Rng & Units 04/02/2016  Glucose 65 - 99 mg/dL -  BUN 8 - 27 mg/dL -  Creatinine 0.61 - 1.24 mg/dL 0.90  Sodium 134 - 144 mmol/L -  Potassium 3.5 - 5.2 mmol/L -  Chloride 96 - 106 mmol/L -  CO2 18 - 29 mmol/L -  Calcium 8.6 - 10.2 mg/dL -  Total Protein 6.0 - 8.5 g/dL -  Total Bilirubin 0.0 - 1.2 mg/dL -  Alkaline Phos 39 - 117 IU/L -  AST 0 - 40 IU/L -  ALT 0 - 44 IU/L -   CBC Latest Ref Rng & Units 08/17/2015  WBC 3.4 - 10.8 x10E3/uL 9.7  Hemoglobin 13.5 - 17.5 g/dL -  Hematocrit 37.5 - 51.0 % 44.0  Platelets 150 - 379 x10E3/uL 195    No images are attached to the encounter.  Ct Soft Tissue Neck W Contrast  Result Date: 04/02/2016 CLINICAL DATA:  Hoarseness for 1 month.  Left vocal cord paralysis. EXAM: CT NECK WITH CONTRAST TECHNIQUE: Multidetector CT imaging of the neck was performed using the standard protocol following the bolus administration of intravenous contrast. CONTRAST:  66m ISOVUE-300 IOPAMIDOL (ISOVUE-300) INJECTION 61% COMPARISON:  None. FINDINGS: Pharynx and larynx: The left true vocal cord is displaced to the left with asymmetry of the laryngeal ventricle compatible with vocal cord paralysis. No focal mucosal or submucosal lesions are present. The nasopharynx, oropharynx, and hypopharynx are otherwise within normal limits. Salivary glands: The parotid and submandibular glands are within normal limits bilaterally. Thyroid: Negative Lymph nodes: No significant cervical adenopathy is present. Prominent rounded lymph nodes are present at the thoracic inlet bilaterally. Vascular: Atherosclerotic calcifications are present at the aortic arch and carotid bifurcations bilaterally without significant stenosis. Limited intracranial: Unremarkable. Visualized  orbits: Within normal limits. Mastoids and visualized paranasal sinuses: Wall thickening is present in the right maxillary sinus. Posttraumatic changes are noted to the anterior maxilla bilaterally. There is no active sinus disease. The mastoid air cells are clear. Skeleton: Multiple lytic lesions are present throughout the cervical and upper thoracic spine to the lowest imaged level, T4. There is no pathologic collapse. Prominent uncovertebral and endplate degenerative changes are present at C3-4, C4-5, and C5-6 most notably. Additional lytic lesions are noted within the superior aspect of the sternum and posterior left second and third ribs. Lytic lesions are present in the posterior right first and fifth ribs. Upper chest: Extensive mediastinal adenopathy is present. A right peritracheal nodal mass measures 16 mm in short axis. There is extensive soft tissue along undersurface of the aortic arch, likely nodal metastases compromising the left recurrent laryngeal nerve. A 1.5 cm short axis low-density right supraclavicular lymph node is present near the sternoclavicular junction on image 100. Paraseptal emphysematous changes are present bilaterally. A 12 mm pleural-based nodule is seen posteriorly in the left upper lobe. No other focal nodule or mass lesion is present. IMPRESSION: 1. Soft tissue mass along the undersurface of the aortic arch is most concerning for a metastatic adenopathy. This likely impacts the left recurrent laryngeal nerve leading to vocal cord paralysis. 2. Extensive mediastinal adenopathy compatible with metastatic disease. 3. Numerous lytic lesions throughout the visualized is cervical and thoracic spine as well is bilateral ribs. These are compatible with additional metastases. 4. 12 mm pleural-based nodule in the left upper lid may represent a primary bronchogenic neoplasm. This is more likely metastatic disease. 5. Findings are most concerning for a primary lung cancer. Recommend CT of the  chest, abdomen, and pelvis with contrast for further evaluation of the primary neoplasm and extent of metastatic disease. These results were called by telephone at the time of interpretation on 04/02/2016 at 3:45 pm to Dr. CCarloyn Manner, who verbally acknowledged these results. Electronically Signed   By: CSan MorelleM.D.   On: 04/02/2016 15:59   Nm Pet Image Initial (pi) Skull Base To Thigh  Result Date: 04/10/2016 CLINICAL DATA:  Initial Treatment strategy for lung mass. Left vocal cord paralysis. Bony lesions on recent CT. Adenopathy. EXAM: NUCLEAR MEDICINE PET SKULL BASE TO THIGH TECHNIQUE: 13.0 mCi F-18 FDG was injected intravenously. Full-ring PET imaging was performed from the skull base to thigh after the radiotracer. CT data was obtained and used for attenuation correction and anatomic localization. FASTING BLOOD GLUCOSE:  Value: 78 mg/dl COMPARISON:  CT neck 04/02/2016 FINDINGS: NECK Bilateral shoddy hypermetabolic supraclavicular adenopathy. An 8 mm right level 4 lymph node has a maximum SUV of 7.3. Subtle increased  signal along the right vocal cord. CHEST Extensive scattered hypermetabolic adenopathy in the chest. In indistinctly marginated AP window lesion measuring about 2.8 cm in short axis has a maximum SUV of 17.4. Additional prevascular, paratracheal, and subcarinal adenopathy noted. Peripheral interstitial accentuation in both lungs with some mild honeycombing. Left upper lobe subpleural nodule 1.4 by 1.0 cm on image 93/3, maximum SUV 8.4. Coronary, aortic arch, and branch vessel atherosclerotic vascular disease. ABDOMEN/PELVIS Innumerable masses are present throughout the liver compatible with extensive hepatic metastatic disease. A representative lesion inferiorly in segment 3 has a maximum SUV of 11.9. Numerous layering gallstones in the gallbladder. Suspected hypermetabolic porta hepatis adenopathy with indistinct tissue planes in this vicinity. Scattered activity in the bowel,  likely mostly physiologic. There is some focal areas of accentuated activity for example in the sigmoid colon where there is a focus of hypermetabolic activity with maximum SUV 9.3, but without significant CT correlate. Aortoiliac atherosclerotic vascular disease. SKELETON Extensive osseous metastatic disease, with involvement of almost all vertebral levels, numerous ribs, the iliac bones, the left pubic bones, the bilateral ischium, and in the right intertrochanteric region (no incipient hip fracture identified). An index lytic lesion anteriorly at L4 measures 1.7 cm in diameter and has a maximum standard uptake value of 12.5 generally speaking these lesions are lytic and some are expansile. IMPRESSION: 1. Extensive lytic malignancy in the skeleton with heavy burden of hepatic metastatic lesions, adenopathy in the chest, lower neck, and likely porta hepatis compatible with malignancy ; and a small left upper lobe pulmonary nodule which is likewise hypermetabolic. The bony disease resembles myeloma but it would be unusual to have this much hepatic and soft tissue involvement in the setting of myeloma. Other possibilities might include an unusually aggressive central lung cancer. Tissue diagnosis recommended. 2. AP window mass is indistinct and may be interfering with the recurrent laryngeal nerve. The asymmetric appearance of the cords is probably secondary to this interference. 3. 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 Cholelithiasis. Electronically Signed   By: Van Clines M.D.   On: 04/10/2016 15:01    Assessment and plan- Patient is a 66 y.o. male who was initially evaluated by ENT for hoarseness of voice. Imaging shows metastatic disease to the lymph nodes, liver, lung and bones  We have reviewed the patient's images at the multidisciplinary conference yesterday  and the consensus was to obtain an ultrasound-guided liver biopsy to a certain tissue diagnosis. I have reviewed the images personally as well.   Today I will also obtain CBC, CMP, PT, PTT/INR prior to liver biopsy. I will see the patient back in 10 days time after pathology is back to discuss further management. I explained to the patient that given the findings of PET/CT scan patient has stage IV malignancy and treatment would likely involve systemic chemotherapy. I will also planto get port placement in anticipation of chemotherapy which can hopefully be done at the time of liver biopsy by IR.  Neoplasm related pain and cough- will start low dose morphine 5 mg PO Q4 prn  Tobacco dependence- patient not interested in quitting at this point   Thank you for this kind referral and the opportunity to participate in the care of this patient   Visit Diagnosis 1. Abnormal positron emission tomography (PET) scan   2. Abnormal CT scan   3. Metastases to the liver ([place])   4. Metastasis to bone ([place])  5. 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 Randa Evens, MD, MPH [facility] Pager- [phone] 04/13/2016  9:26 AM

## 2016-04-13 NOTE — Telephone Encounter (Signed)
Opened in error

## 2016-04-13 NOTE — Progress Notes (Signed)
Reports pain 3/10 right side/rib area, constant and persistent, nothing for pain given to the patient at this time.  Pain increases with cough, has no cough medicine or pain medicine available.

## 2016-04-16 ENCOUNTER — Ambulatory Visit: Admission: RE | Admit: 2016-04-16 | Payer: Medicare HMO | Source: Ambulatory Visit | Admitting: Vascular Surgery

## 2016-04-16 ENCOUNTER — Other Ambulatory Visit: Payer: Self-pay | Admitting: Cardiology

## 2016-04-16 ENCOUNTER — Encounter: Admission: RE | Payer: Self-pay | Source: Ambulatory Visit

## 2016-04-16 ENCOUNTER — Ambulatory Visit
Admission: RE | Admit: 2016-04-16 | Discharge: 2016-04-16 | Disposition: A | Discharge status: Home / Self Care | Payer: Medicare HMO | Source: Ambulatory Visit | Attending: Oncology | Admitting: Oncology

## 2016-04-16 ENCOUNTER — Encounter: Payer: Self-pay | Admitting: Oncology

## 2016-04-16 DIAGNOSIS — C7951 Secondary malignant neoplasm of bone: Secondary | ICD-10-CM

## 2016-04-16 DIAGNOSIS — R9389 Abnormal findings on diagnostic imaging of other specified body structures: Secondary | ICD-10-CM

## 2016-04-16 DIAGNOSIS — C778 Secondary and unspecified malignant neoplasm of lymph nodes of multiple regions: Secondary | ICD-10-CM

## 2016-04-16 DIAGNOSIS — R948 Abnormal results of function studies of other organs and systems: Secondary | ICD-10-CM

## 2016-04-16 DIAGNOSIS — R938 Abnormal findings on diagnostic imaging of other specified body structures: Secondary | ICD-10-CM | POA: Diagnosis not present

## 2016-04-16 DIAGNOSIS — C787 Secondary malignant neoplasm of liver and intrahepatic bile duct: Secondary | ICD-10-CM | POA: Diagnosis present

## 2016-04-16 DIAGNOSIS — R918 Other nonspecific abnormal finding of lung field: Secondary | ICD-10-CM

## 2016-04-16 HISTORY — PX: IR FLUORO GUIDE PORT INSERTION RIGHT: IMG5741

## 2016-04-16 HISTORY — DX: Dyspnea, unspecified: R06.00

## 2016-04-16 LAB — CBC
HEMATOCRIT: 41.4 % (ref 40.0–52.0)
HEMOGLOBIN: 14.1 g/dL (ref 13.0–18.0)
MCH: 31.2 pg (ref 26.0–34.0)
MCHC: 33.9 g/dL (ref 32.0–36.0)
MCV: 91.9 fL (ref 80.0–100.0)
Platelets: 190 10*3/uL (ref 150–440)
RBC: 4.51 MIL/uL (ref 4.40–5.90)
RDW: 13.7 % (ref 11.5–14.5)
WBC: 7.8 10*3/uL (ref 3.8–10.6)

## 2016-04-16 LAB — PROTIME-INR
INR: 1.06
Prothrombin Time: 13.8 seconds (ref 11.4–15.2)

## 2016-04-16 LAB — APTT: aPTT: 27 seconds (ref 24–36)

## 2016-04-16 SURGERY — PORTA CATH INSERTION
Anesthesia: Moderate Sedation

## 2016-04-16 MED ORDER — HEPARIN SOD (PORK) LOCK FLUSH 100 UNIT/ML IV SOLN
INTRAVENOUS | Status: AC
  Filled 2016-04-16: qty 5

## 2016-04-16 MED ORDER — SODIUM CHLORIDE 0.9 % IV SOLN
INTRAVENOUS | Status: DC
  Administered 2016-04-16: 12:00:00 via INTRAVENOUS

## 2016-04-16 MED ORDER — FENTANYL CITRATE (PF) 100 MCG/2ML IJ SOLN
INTRAMUSCULAR | Status: AC | PRN
  Administered 2016-04-16 (×2): 12.5 ug via INTRAVENOUS
  Administered 2016-04-16: 50 ug via INTRAVENOUS

## 2016-04-16 MED ORDER — FENTANYL CITRATE (PF) 100 MCG/2ML IJ SOLN
INTRAMUSCULAR | Status: AC
  Filled 2016-04-16: qty 4

## 2016-04-16 MED ORDER — MIDAZOLAM HCL 5 MG/5ML IJ SOLN
INTRAMUSCULAR | Status: AC | PRN
  Administered 2016-04-16: 0.5 mg via INTRAVENOUS
  Administered 2016-04-16: 1 mg via INTRAVENOUS

## 2016-04-16 MED ORDER — FENTANYL CITRATE (PF) 100 MCG/2ML IJ SOLN
INTRAMUSCULAR | Status: AC | PRN
  Administered 2016-04-16 (×3): 25 ug via INTRAVENOUS

## 2016-04-16 MED ORDER — MIDAZOLAM HCL 5 MG/5ML IJ SOLN
INTRAMUSCULAR | Status: AC | PRN
  Administered 2016-04-16 (×4): 0.5 mg via INTRAVENOUS

## 2016-04-16 MED ORDER — CEFAZOLIN SODIUM-DEXTROSE 2-4 GM/100ML-% IV SOLN
2.0000 g | Freq: Once | INTRAVENOUS | Status: DC
  Filled 2016-04-16: qty 100

## 2016-04-16 MED ORDER — HYDROCODONE-ACETAMINOPHEN 5-325 MG PO TABS: 1.0000 | ORAL_TABLET | ORAL | Status: DC | PRN

## 2016-04-16 MED ORDER — MIDAZOLAM HCL 5 MG/5ML IJ SOLN
INTRAMUSCULAR | Status: AC
  Filled 2016-04-16: qty 10

## 2016-04-16 NOTE — H&P (Addendum)
Chief Complaint: Patient was seen in consultation today for liver biopsy and port placement at the request of Dr. Randa Evens  Referring Physician(s): Randa Evens  Patient Status: [facility] - Out-pt  History of Present Illness: Austin Bryan is a 66 y.o. male recently evaluated by Dr. 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  Liver involvement is extensive and the patient has some mild pain of right ribs and in region of liver.  Now presents for US guided biopsy of one of the liver masses and port placement today.  Past Medical History:  Diagnosis Date  . Allergy   . Diabetes mellitus without complication ([place])   . Dyspnea     Past Surgical History:  Procedure Laterality Date  . ADENOIDECTOMY    . COLONOSCOPY    . TONSILLECTOMY      Allergies: Patient has no known allergies.  Medications: Prior to Admission medications   Medication Sig Start Date End Date Taking? Authorizing Provider  ranitidine (ZANTAC) 150 MG tablet Take 1 tablet (150 mg total) by mouth 2 (two) times daily. 09/14/15  Yes Tawni Millers, MD  fexofenadine (ALLEGRA) 180 MG tablet Take 1 tablet (180 mg total) by mouth daily. 09/14/15   Tawni Millers, MD  fluticasone (FLONASE) 50 MCG/ACT nasal spray Place 2 sprays into both nostrils daily.    Historical Provider, MD  metFORMIN (GLUCOPHAGE) 500 MG tablet Take 1 tablet (500 mg total) by mouth 2 (two) times daily with a meal. 09/14/15   Tawni Millers, MD  Morphine Sulfate (MORPHINE CONCENTRATE) 10 mg / 0.5 ml concentrated solution Take 0.25 mLs (5 mg total) by mouth every 4 (four) hours as needed for severe pain. Patient not taking: Reported on 04/16/2016 04/13/16 05/13/16  Lucendia Herrlich, NP     Family History  Problem Relation Age of Onset  .  Cancer Mother   . Cancer Father     Social History   Social History  . Marital status: Divorced    Spouse name: N/A  . Number of children: N/A  . Years of education: N/A   Social History Main Topics  . Smoking status: Current Every Day Smoker    Packs/day: 1.00    Years: 40.00    Types: Cigarettes  . Smokeless tobacco: Never Used  . Alcohol use No  . Drug use: No  . Sexual activity: Not Asked   Other Topics Concern  . None   Social History Narrative  . None    ECOG Status: 1 - Symptomatic but completely ambulatory  Review of Systems: A 12 point ROS discussed and pertinent positives are indicated in the HPI above.  All other systems are negative.  Review of Systems  Constitutional: Positive for unexpected weight change. Negative for activity change, diaphoresis and fever.  HENT: Positive for voice change.   Respiratory: Negative.   Cardiovascular: Negative.   Gastrointestinal: Negative.   Genitourinary: Negative.   Musculoskeletal:       Right rib/chest wall pain.   Neurological: Negative.     Vital Signs: BP 132/89   Pulse 80   Temp 97.9 F (36.6 C) (Oral)   Resp 18   SpO2 96%   Physical Exam  Constitutional: He is oriented to person, place, and time. No distress.  HENT:  Head: Normocephalic and atraumatic.  Neck: Neck supple. No JVD present. No tracheal deviation present. No thyromegaly present.  Cardiovascular: Normal rate, regular rhythm and normal heart sounds.  Exam reveals no gallop and no friction rub.   No murmur heard. Pulmonary/Chest: Effort normal and breath sounds normal. No stridor. No respiratory distress. He has no wheezes. He has no rales.  Abdominal: Soft. Bowel sounds are normal. He exhibits no distension and no mass. There is no tenderness. There is no rebound and no guarding.  Musculoskeletal: He exhibits no edema.  Neurological: He is alert and oriented to person, place, and time.  Skin: Skin is warm and dry. He is not  diaphoretic.    Mallampati Score:  MD Evaluation Airway: WNL Heart: WNL Abdomen: WNL Chest/ Lungs: WNL ASA  Classification: 3 Mallampati/Airway Score: One  Imaging: Ct Soft Tissue Neck W Contrast  Result Date: 04/02/2016 CLINICAL DATA:  Hoarseness for 1 month.  Left vocal cord paralysis. EXAM: CT NECK WITH CONTRAST TECHNIQUE: Multidetector CT imaging of the neck was performed using the standard protocol following the bolus administration of intravenous contrast. CONTRAST:  24m ISOVUE-300 IOPAMIDOL (ISOVUE-300) INJECTION 61% COMPARISON:  None. FINDINGS: Pharynx and larynx: The left true vocal cord is displaced to the left with asymmetry of the laryngeal ventricle compatible with vocal cord paralysis. No focal mucosal or submucosal lesions are present. The nasopharynx, oropharynx, and hypopharynx are otherwise within normal limits. Salivary glands: The parotid and submandibular glands are within normal limits bilaterally. Thyroid: Negative Lymph nodes: No significant cervical adenopathy is present. Prominent rounded lymph nodes are present at the thoracic inlet bilaterally. Vascular: Atherosclerotic calcifications are present at the aortic arch and carotid bifurcations bilaterally without significant stenosis. Limited intracranial: Unremarkable. Visualized orbits: Within normal limits. Mastoids and visualized paranasal sinuses: Wall thickening is present in the right maxillary sinus. Posttraumatic changes are noted to the anterior maxilla bilaterally. There is no active sinus disease. The mastoid air cells are clear. Skeleton: Multiple lytic lesions are present throughout the cervical and upper thoracic spine to the lowest imaged level, T4. There is no pathologic collapse. Prominent uncovertebral and endplate degenerative changes are present at C3-4, C4-5, and C5-6 most notably. Additional lytic lesions are noted within the superior aspect of the sternum and posterior left second and third ribs. Lytic  lesions are present in the posterior right first and fifth ribs. Upper chest: Extensive mediastinal adenopathy is present. A right peritracheal nodal mass measures 16 mm in short axis. There is extensive soft tissue along undersurface of the aortic arch, likely nodal metastases compromising the left recurrent laryngeal nerve. A 1.5 cm short axis low-density right supraclavicular lymph node is present near the sternoclavicular junction on image 100. Paraseptal emphysematous changes are present bilaterally. A 12 mm pleural-based nodule is seen posteriorly in the left upper lobe. No other focal nodule or mass lesion is present. IMPRESSION: 1. Soft tissue mass along the undersurface of the aortic arch is most concerning for a metastatic adenopathy. This likely impacts the left recurrent laryngeal nerve leading to vocal cord paralysis. 2. Extensive mediastinal adenopathy compatible with metastatic disease. 3. Numerous lytic lesions throughout the visualized is cervical and thoracic spine as well is bilateral ribs. These are compatible with additional metastases. 4. 12 mm pleural-based nodule in the left upper lid may represent a primary bronchogenic neoplasm. This is more likely metastatic disease. 5. Findings are most concerning for a primary lung cancer. Recommend CT of the chest, abdomen, and pelvis with contrast for further evaluation of the primary neoplasm and  extent of metastatic disease. These results were called by telephone at the time of interpretation on 04/02/2016 at 3:45 pm to Dr. Carloyn Manner , who verbally acknowledged these results. Electronically Signed   By: San Morelle M.D.   On: 04/02/2016 15:59   Nm Pet Image Initial (pi) Skull Base To Thigh  Result Date: 04/10/2016 CLINICAL DATA:  Initial Treatment strategy for lung mass. Left vocal cord paralysis. Bony lesions on recent CT. Adenopathy. EXAM: NUCLEAR MEDICINE PET SKULL BASE TO THIGH TECHNIQUE: 13.0 mCi F-18 FDG was injected  intravenously. Full-ring PET imaging was performed from the skull base to thigh after the radiotracer. CT data was obtained and used for attenuation correction and anatomic localization. FASTING BLOOD GLUCOSE:  Value: 78 mg/dl COMPARISON:  CT neck 04/02/2016 FINDINGS: NECK Bilateral shoddy hypermetabolic supraclavicular adenopathy. An 8 mm right level 4 lymph node has a maximum SUV of 7.3. Subtle increased signal along the right vocal cord. CHEST Extensive scattered hypermetabolic adenopathy in the chest. In indistinctly marginated AP window lesion measuring about 2.8 cm in short axis has a maximum SUV of 17.4. Additional prevascular, paratracheal, and subcarinal adenopathy noted. Peripheral interstitial accentuation in both lungs with some mild honeycombing. Left upper lobe subpleural nodule 1.4 by 1.0 cm on image 93/3, maximum SUV 8.4. Coronary, aortic arch, and branch vessel atherosclerotic vascular disease. ABDOMEN/PELVIS Innumerable masses are present throughout the liver compatible with extensive hepatic metastatic disease. A representative lesion inferiorly in segment 3 has a maximum SUV of 11.9. Numerous layering gallstones in the gallbladder. Suspected hypermetabolic porta hepatis adenopathy with indistinct tissue planes in this vicinity. Scattered activity in the bowel, likely mostly physiologic. There is some focal areas of accentuated activity for example in the sigmoid colon where there is a focus of hypermetabolic activity with maximum SUV 9.3, but without significant CT correlate. Aortoiliac atherosclerotic vascular disease. SKELETON Extensive osseous metastatic disease, with involvement of almost all vertebral levels, numerous ribs, the iliac bones, the left pubic bones, the bilateral ischium, and in the right intertrochanteric region (no incipient hip fracture identified). An index lytic lesion anteriorly at L4 measures 1.7 cm in diameter and has a maximum standard uptake value of 12.5 generally  speaking these lesions are lytic and some are expansile. IMPRESSION: 1. Extensive lytic malignancy in the skeleton with heavy burden of hepatic metastatic lesions, adenopathy in the chest, lower neck, and likely porta hepatis compatible with malignancy ; and a small left upper lobe pulmonary nodule which is likewise hypermetabolic. The bony disease resembles myeloma but it would be unusual to have this much hepatic and soft tissue involvement in the setting of myeloma. Other possibilities might include an unusually aggressive central lung cancer. Tissue diagnosis recommended. 2. AP window mass is indistinct and may be interfering with the recurrent laryngeal nerve. The asymmetric appearance of the cords is probably secondary to this interference. 3. 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 Cholelithiasis. Electronically Signed   By: Van Clines M.D.   On: 04/10/2016 15:01    Labs:  CBC:  Recent Labs  08/17/15 1055 04/13/16 0920 04/16/16 1016  WBC 9.7 7.5 7.8  HGB  --  14.0 14.1  HCT 44.0 40.9 41.4  PLT 195 181 190    COAGS:  Recent Labs  04/13/16 0920 04/16/16 1016  INR 1.05 1.06  APTT  --  27    BMP:  Recent Labs  08/17/15 1055 04/02/16 1456 04/13/16 0920  NA 141  --  135  K 4.8  --  4.2  CL 100  --  100*  CO2 26  --  28  GLUCOSE 82  --  93  BUN 12  --  16  CALCIUM 9.2  --  9.2  CREATININE 0.98 0.90 0.91  GFRNONAA 81  --  >60  GFRAA 94  --  >60    LIVER FUNCTION TESTS:  Recent Labs  08/17/15 1055 04/13/16 0920  BILITOT 0.2 0.8  AST 15 36  ALT 12 34  ALKPHOS 84 97  PROT 6.8 7.5  ALBUMIN 4.3 3.9    Assessment and Plan:  For US guided liver lesion biopsy followed by porta-cath placement today under IV conscious sedation.  Risks and benefits discussed with the patient including, but not limited to bleeding,  infection, damage to adjacent structures or low yield requiring additional tests. Additional risks of air embolism and pneumothorax with port placement. All of the patient's questions were answered, patient is agreeable to proceed. Consent signed and in chart.  Thank you for this interesting consult.  I greatly enjoyed meeting Austin Bryan and look forward to participating in their care.  A copy of this report was sent to the requesting provider on this date.  Electronically SignedAletta Edouard T 04/16/2016, 11:15 AM     I spent a total of 30 Minutes in face to face in clinical consultation, greater than 50% of which was counseling/coordinating care for liver biopsy and port placement.

## 2016-04-16 NOTE — Procedures (Signed)
Interventional Radiology Procedure Note  Procedure:  US guided core biopsy of left lobe liver lesion. Placement of a right IJ approach single lumen PowerPort.  Tip is positioned at the superior cavoatrial junction and catheter is ready for immediate use.  Complications: No immediate Recommendations:   - Bedrest x 2 hours. - Ok to shower tomorrow - Do not submerge for 7 days - Routine line care   Suzy Kugel T. Kathlene Cote, M.D Pager:  [phone]

## 2016-04-16 NOTE — Progress Notes (Signed)
Patient tolerated US Liver biopsy well.  Taken directly into [facility]IR for port placement post liver biopsy.

## 2016-04-18 LAB — SURGICAL PATHOLOGY

## 2016-04-19 ENCOUNTER — Ambulatory Visit: Payer: Medicare HMO | Admitting: Oncology

## 2016-04-19 ENCOUNTER — Other Ambulatory Visit: Payer: Medicare HMO

## 2016-04-20 ENCOUNTER — Encounter: Payer: Self-pay | Admitting: Oncology

## 2016-04-20 ENCOUNTER — Encounter: Payer: Self-pay | Admitting: *Deleted

## 2016-04-20 ENCOUNTER — Inpatient Hospital Stay (HOSPITAL_BASED_OUTPATIENT_CLINIC_OR_DEPARTMENT_OTHER): Payer: Medicare HMO | Admitting: Oncology

## 2016-04-20 ENCOUNTER — Inpatient Hospital Stay: Payer: Medicare HMO | Attending: Oncology

## 2016-04-20 VITALS — BP 121/83 | HR 88 | Temp 95.5°F | Resp 18 | Wt 167.2 lb

## 2016-04-20 DIAGNOSIS — C7951 Secondary malignant neoplasm of bone: Secondary | ICD-10-CM | POA: Insufficient documentation

## 2016-04-20 DIAGNOSIS — Z7984 Long term (current) use of oral hypoglycemic drugs: Secondary | ICD-10-CM | POA: Diagnosis not present

## 2016-04-20 DIAGNOSIS — C787 Secondary malignant neoplasm of liver and intrahepatic bile duct: Secondary | ICD-10-CM | POA: Diagnosis not present

## 2016-04-20 DIAGNOSIS — R634 Abnormal weight loss: Secondary | ICD-10-CM | POA: Diagnosis not present

## 2016-04-20 DIAGNOSIS — I2510 Atherosclerotic heart disease of native coronary artery without angina pectoris: Secondary | ICD-10-CM

## 2016-04-20 DIAGNOSIS — K8020 Calculus of gallbladder without cholecystitis without obstruction: Secondary | ICD-10-CM | POA: Diagnosis not present

## 2016-04-20 DIAGNOSIS — R590 Localized enlarged lymph nodes: Secondary | ICD-10-CM | POA: Diagnosis not present

## 2016-04-20 DIAGNOSIS — G4730 Sleep apnea, unspecified: Secondary | ICD-10-CM | POA: Insufficient documentation

## 2016-04-20 DIAGNOSIS — J3801 Paralysis of vocal cords and larynx, unilateral: Secondary | ICD-10-CM | POA: Insufficient documentation

## 2016-04-20 DIAGNOSIS — I700 Atherosclerosis of aorta: Secondary | ICD-10-CM | POA: Insufficient documentation

## 2016-04-20 DIAGNOSIS — E119 Type 2 diabetes mellitus without complications: Secondary | ICD-10-CM | POA: Diagnosis not present

## 2016-04-20 DIAGNOSIS — Z5111 Encounter for antineoplastic chemotherapy: Secondary | ICD-10-CM | POA: Insufficient documentation

## 2016-04-20 DIAGNOSIS — F17210 Nicotine dependence, cigarettes, uncomplicated: Secondary | ICD-10-CM | POA: Diagnosis not present

## 2016-04-20 DIAGNOSIS — R5383 Other fatigue: Secondary | ICD-10-CM | POA: Insufficient documentation

## 2016-04-20 DIAGNOSIS — G893 Neoplasm related pain (acute) (chronic): Secondary | ICD-10-CM

## 2016-04-20 DIAGNOSIS — C3412 Malignant neoplasm of upper lobe, left bronchus or lung: Secondary | ICD-10-CM | POA: Insufficient documentation

## 2016-04-20 DIAGNOSIS — R918 Other nonspecific abnormal finding of lung field: Secondary | ICD-10-CM

## 2016-04-20 DIAGNOSIS — Z79899 Other long term (current) drug therapy: Secondary | ICD-10-CM

## 2016-04-20 DIAGNOSIS — C3490 Malignant neoplasm of unspecified part of unspecified bronchus or lung: Secondary | ICD-10-CM | POA: Insufficient documentation

## 2016-04-20 DIAGNOSIS — Z7189 Other specified counseling: Secondary | ICD-10-CM | POA: Insufficient documentation

## 2016-04-20 MED ORDER — LIDOCAINE-PRILOCAINE 2.5-2.5 % EX CREA: TOPICAL_CREAM | CUTANEOUS | 3 refills | Status: DC

## 2016-04-20 MED ORDER — LORAZEPAM 0.5 MG PO TABS: 0.5000 mg | ORAL_TABLET | Freq: Four times a day (QID) | ORAL | 0 refills | Status: DC | PRN

## 2016-04-20 MED ORDER — PREDNISONE 5 MG PO TABS: 5.0000 mg | ORAL_TABLET | Freq: Two times a day (BID) | ORAL | 0 refills | Status: AC

## 2016-04-20 MED ORDER — ONDANSETRON HCL 8 MG PO TABS: 8.0000 mg | ORAL_TABLET | Freq: Two times a day (BID) | ORAL | 1 refills | Status: DC | PRN

## 2016-04-20 MED ORDER — DEXAMETHASONE 4 MG PO TABS: ORAL_TABLET | ORAL | 1 refills | Status: DC

## 2016-04-20 MED ORDER — FOLIC ACID 1 MG PO TABS: 1.0000 mg | ORAL_TABLET | Freq: Every day | ORAL | 3 refills | Status: DC

## 2016-04-20 MED ORDER — LIDOCAINE 5 % EX PTCH: 1.0000 | MEDICATED_PATCH | CUTANEOUS | 0 refills | Status: DC

## 2016-04-20 MED ORDER — PROCHLORPERAZINE MALEATE 10 MG PO TABS: 10.0000 mg | ORAL_TABLET | Freq: Four times a day (QID) | ORAL | 1 refills | Status: DC | PRN

## 2016-04-20 NOTE — Progress Notes (Signed)
  Oncology Nurse Navigator Documentation  Navigator Location: [facility] (04/20/16 1300)   )Navigator Encounter Type: [facility] Follow-up (04/20/16 1300)                         Barriers/Navigation Needs: No barriers at this time;No Questions;No Needs (04/20/16 1300)   Interventions: Coordination of Care (04/20/16 1300)   Coordination of Care: Appts (04/20/16 1300)       met with patient during follow up visit with Dr. Janese Banks to review biopsy results and discuss treatment planning. Pt still had bandage on biopsy site and PAC placement site. Bandages were removed during visit. Skin was clean, dry, and intact without signs of infection. Education was offered to the patient and his daughter regarding PAC. Appts reviewed with the patient and his daughter. No further needs at this time.            Time Spent with Patient: 15 (04/20/16 1300)

## 2016-04-20 NOTE — Progress Notes (Signed)
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 Patient is a 66 year old male who was seen by Dr. walk from ENT for symptoms of hoarseness of voice for 1 month. 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 Patient is a chronic smoker and has smoked about 1-1/2 packs per day for more than 30 years. He has lost about 17 pounds of weight over the last 6-7 months. Patient lives with his sister and is independent of his ADLs and IADLs.  2. CT soft tissue of the neck on 04/02/2016 showed: 1. Soft tissue mass along the undersurface of the aortic arch is most concerning for a metastatic adenopathy. This likely impacts the left recurrent laryngeal nerve leading to vocal cord paralysis. 2. Extensive mediastinal adenopathy compatible with metastatic disease. 3. Numerous lytic lesions throughout the visualized is cervical and thoracic spine as well is bilateral ribs. These are compatible with additional metastases. 4. 12 mm pleural-based nodule in the left upper lid may represent a primary bronchogenic neoplasm. This is more likely metastatic disease. 5. Findings are most concerning for a primary lung cancer. Recommend CT of the chest, abdomen, and pelvis with contrast for further evaluation of the primary neoplasm and extent of metastatic disease.  3. PET CT scan on 04/10/2016 showed:IMPRESSION:1. Extensive lytic malignancy in the skeleton with heavy burden of hepatic metastatic lesions, adenopathy in the chest, lower neck, and likely porta hepatis compatible with malignancy ; and a small left upper lobe pulmonary  nodule which is likewise hypermetabolic. The bony disease resembles myeloma but it would be unusual to have this much hepatic and soft tissue involvement in the setting of myeloma. Other possibilities might include an unusually aggressive central lung cancer. Tissue diagnosis recommended. 2. AP window mass is indistinct and may be interfering with the recurrent laryngeal nerve. The asymmetric appearance of the cords is probably secondary to this interference. 3. 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 Cholelithiasis.  4. US guided liver biopsy showed: DIAGNOSIS:  A. LIVER MASS, LEFT LOBE; ULTRASOUND GUIDED BIOPSY:  - METASTATIC NON-SMALL CELL CARCINOMA, TTF-1 POSITIVE.   Comment:  A limited panel of immunohistochemical stains was performed. The  metastatic malignant neoplasm demonstrates the following pattern of  immunoreactivity:  Super pancytokeratin: Positive  TTF-1: Positive  CD56: Negative  CDX-2: Negative  Stain controls worked appropriately. The morphologic findings are  consistent with metastatic poorly differentiated carcinoma. The presence  of TTF-1 positivity is compatible with lung origin.  Interval history- patient continues to have pain in the right upper quadrant of his abdomen as well as between his 8-10 with. He has only used 2 doses of morphine so far and is hesitant to use more doses as it makes him feel drowsy at times. Denies any constipation.  ECOG PS- 2 Pain scale- 7- RUQ Opioid associated constipation- no  Review of systems- Review of Systems  Constitutional: Positive for malaise/fatigue and weight loss. Negative for chills and fever.  HENT: Negative for congestion, ear discharge and nosebleeds.        Hoarseness of voice +  Eyes: Negative for blurred vision.  Respiratory: Positive for cough and shortness of breath. Negative  for hemoptysis, sputum production and wheezing.   Cardiovascular: Negative for chest pain, palpitations, orthopnea and claudication.       Right sided chest wall pain  Gastrointestinal: Negative for abdominal pain, blood in stool, constipation, diarrhea, heartburn, melena, nausea and vomiting.  Genitourinary: Negative for dysuria, flank pain, frequency, hematuria and urgency.  Musculoskeletal: Negative for back pain, joint pain and myalgias.  Skin: Negative for rash.  Neurological: Positive for weakness. Negative for dizziness, tingling, focal weakness, seizures and headaches.  Endo/Heme/Allergies: Does not bruise/bleed easily.  Psychiatric/Behavioral: Negative for depression and suicidal ideas. The patient does not have insomnia.      Current treatment- yet to start  No Known Allergies   Past Medical History:  Diagnosis Date  . Allergy   . Diabetes mellitus without complication ([place])   . Dyspnea      Past Surgical History:  Procedure Laterality Date  . ADENOIDECTOMY    . COLONOSCOPY    . IR FLUORO GUIDE PORT INSERTION RIGHT  04/16/2016  . TONSILLECTOMY      Social History   Social History  . Marital status: Divorced    Spouse name: N/A  . Number of children: N/A  . Years of education: N/A   Occupational History  . Not on file.   Social History Main Topics  . Smoking status: Current Every Day Smoker    Packs/day: 1.00    Years: 40.00    Types: Cigarettes  . Smokeless tobacco: Never Used  . Alcohol use No  . Drug use: No  . Sexual activity: Not on file   Other Topics Concern  . Not on file   Social History Narrative  . No narrative on file    Family History  Problem Relation Age of Onset  . Cancer Mother   . Cancer Father      Current Outpatient Prescriptions:  .  fexofenadine (ALLEGRA) 180 MG tablet, Take 1 tablet (180 mg total) by mouth daily., Disp: 90 tablet, Rfl: 2 .  fluticasone (FLONASE) 50 MCG/ACT nasal spray, Place 2 sprays into both  nostrils daily., Disp: , Rfl:  .  metFORMIN (GLUCOPHAGE) 500 MG tablet, Take 1 tablet (500 mg total) by mouth 2 (two) times daily with a meal., Disp: 180 tablet, Rfl: 2 .  Morphine Sulfate (MORPHINE CONCENTRATE) 10 mg / 0.5 ml concentrated solution, Take 0.25 mLs (5 mg total) by mouth every 4 (four) hours as needed for severe pain., Disp: 30 mL, Rfl: 0 .  ranitidine (ZANTAC) 150 MG tablet, Take 1 tablet (150 mg total) by mouth 2 (two) times daily., Disp: 180 tablet, Rfl: 2 .  dexamethasone (DECADRON) 4 MG tablet, Take 1 tab two times a day the day before Alimta chemo. Take 2 tabs two times a day starting the day after chemo for 3 days., Disp: 30 tablet, Rfl: 1 .  folic acid (FOLVITE) 1 MG tablet, Take 1 tablet (1 mg total) by mouth daily. Start 5-7 days before Alimta chemotherapy. Continue until 21 days after Alimta completed., Disp: 100 tablet, Rfl: 3 .  lidocaine (LIDODERM) 5 %, Place 1 patch onto the skin daily. Remove & Discard patch within 12 hours or as directed by MD, Disp: 30 patch, Rfl: 0 .  lidocaine-prilocaine (EMLA) cream, Apply to affected area once, Disp: 30 g, Rfl: 3 .  lidocaine-prilocaine (EMLA) cream, Apply to affected area once, Disp: 30 g, Rfl: 3 .  LORazepam (ATIVAN) 0.5 MG tablet, Take 1 tablet (0.5 mg total) by mouth every 6 (six) hours as needed (Nausea or vomiting)., Disp: 30 tablet, Rfl: 0 .  ondansetron (ZOFRAN) 8 MG tablet, Take 1 tablet (8 mg total) by mouth 2 (two) times daily as needed for refractory nausea / vomiting. Start on day 3 after chemo., Disp: 30 tablet, Rfl: 1 .  predniSONE (DELTASONE) 5 MG tablet, Take 1 tablet (5 mg total) by mouth 2 (two) times daily with a meal., Disp: 28 tablet, Rfl: 0 .  prochlorperazine (COMPAZINE) 10 MG tablet, Take 1 tablet (10 mg total) by mouth every 6 (six) hours as needed (Nausea or vomiting)., Disp: 30 tablet, Rfl: 1  Physical exam:  Vitals:   04/20/16 1146  BP: 121/83  Pulse: 88  Resp: 18  Temp: (!) 95.5 F (35.3 C)    TempSrc: Tympanic  Weight: 167 lb 3.2 oz (75.8 kg)   Physical Exam  Constitutional: He is oriented to person, place, and time.  Appears in mild distress from right sided chest wall pain  HENT:  Head: Normocephalic and atraumatic.  Eyes: EOM are normal. Pupils are equal, round, and reactive to light.  Neck: Normal range of motion.  Cardiovascular: Normal rate, regular rhythm and normal heart sounds.   Pulmonary/Chest: Effort normal and breath sounds normal.  Chest wall port in place  Abdominal: Soft. Bowel sounds are normal.  Neurological: He is alert and oriented to person, place, and time.  Skin: Skin is warm and dry.     CMP Latest Ref Rng & Units 04/13/2016  Glucose 65 - 99 mg/dL 93  BUN 6 - 20 mg/dL 16  Creatinine 0.61 - 1.24 mg/dL 0.91  Sodium 135 - 145 mmol/L 135  Potassium 3.5 - 5.1 mmol/L 4.2  Chloride 101 - 111 mmol/L 100(L)  CO2 22 - 32 mmol/L 28  Calcium 8.9 - 10.3 mg/dL 9.2  Total Protein 6.5 - 8.1 g/dL 7.5  Total Bilirubin 0.3 - 1.2 mg/dL 0.8  Alkaline Phos 38 - 126 U/L 97  AST 15 - 41 U/L 36  ALT 17 - 63 U/L 34   CBC Latest Ref Rng & Units 04/16/2016  WBC 3.8 - 10.6 K/uL 7.8  Hemoglobin 13.0 - 18.0 g/dL 14.1  Hematocrit 40.0 - 52.0 % 41.4  Platelets 150 - 440 K/uL 190        Ct Soft Tissue Neck W Contrast  Result Date: 04/02/2016 CLINICAL DATA:  Hoarseness for 1 month.  Left vocal cord paralysis. EXAM: CT NECK WITH CONTRAST TECHNIQUE: Multidetector CT imaging of the neck was performed using the standard protocol following the bolus administration of intravenous contrast. CONTRAST:  75m ISOVUE-300 IOPAMIDOL (ISOVUE-300) INJECTION 61% COMPARISON:  None. FINDINGS: Pharynx and larynx: The left true vocal cord is displaced to the left with asymmetry of the laryngeal ventricle compatible with vocal cord paralysis. No focal mucosal or submucosal lesions are present. The nasopharynx, oropharynx, and hypopharynx are otherwise within normal limits. Salivary glands:  The parotid and submandibular glands are within normal limits bilaterally. Thyroid: Negative Lymph nodes: No significant cervical adenopathy is present. Prominent rounded lymph nodes are present at the thoracic inlet bilaterally. Vascular: Atherosclerotic calcifications are present at the aortic arch and carotid bifurcations bilaterally without significant stenosis. Limited intracranial: Unremarkable. Visualized orbits: Within normal limits. Mastoids and visualized paranasal sinuses: Wall thickening is present in the right maxillary sinus. Posttraumatic changes are noted to the anterior maxilla bilaterally. There is no active sinus disease. The mastoid air  cells are clear. Skeleton: Multiple lytic lesions are present throughout the cervical and upper thoracic spine to the lowest imaged level, T4. There is no pathologic collapse. Prominent uncovertebral and endplate degenerative changes are present at C3-4, C4-5, and C5-6 most notably. Additional lytic lesions are noted within the superior aspect of the sternum and posterior left second and third ribs. Lytic lesions are present in the posterior right first and fifth ribs. Upper chest: Extensive mediastinal adenopathy is present. A right peritracheal nodal mass measures 16 mm in short axis. There is extensive soft tissue along undersurface of the aortic arch, likely nodal metastases compromising the left recurrent laryngeal nerve. A 1.5 cm short axis low-density right supraclavicular lymph node is present near the sternoclavicular junction on image 100. Paraseptal emphysematous changes are present bilaterally. A 12 mm pleural-based nodule is seen posteriorly in the left upper lobe. No other focal nodule or mass lesion is present. IMPRESSION: 1. Soft tissue mass along the undersurface of the aortic arch is most concerning for a metastatic adenopathy. This likely impacts the left recurrent laryngeal nerve leading to vocal cord paralysis. 2. Extensive mediastinal  adenopathy compatible with metastatic disease. 3. Numerous lytic lesions throughout the visualized is cervical and thoracic spine as well is bilateral ribs. These are compatible with additional metastases. 4. 12 mm pleural-based nodule in the left upper lid may represent a primary bronchogenic neoplasm. This is more likely metastatic disease. 5. Findings are most concerning for a primary lung cancer. Recommend CT of the chest, abdomen, and pelvis with contrast for further evaluation of the primary neoplasm and extent of metastatic disease. These results were called by telephone at the time of interpretation on 04/02/2016 at 3:45 pm to Dr. Carloyn Manner , who verbally acknowledged these results. Electronically Signed   By: San Morelle M.D.   On: 04/02/2016 15:59   Nm Pet Image Initial (pi) Skull Base To Thigh  Result Date: 04/10/2016 CLINICAL DATA:  Initial Treatment strategy for lung mass. Left vocal cord paralysis. Bony lesions on recent CT. Adenopathy. EXAM: NUCLEAR MEDICINE PET SKULL BASE TO THIGH TECHNIQUE: 13.0 mCi F-18 FDG was injected intravenously. Full-ring PET imaging was performed from the skull base to thigh after the radiotracer. CT data was obtained and used for attenuation correction and anatomic localization. FASTING BLOOD GLUCOSE:  Value: 78 mg/dl COMPARISON:  CT neck 04/02/2016 FINDINGS: NECK Bilateral shoddy hypermetabolic supraclavicular adenopathy. An 8 mm right level 4 lymph node has a maximum SUV of 7.3. Subtle increased signal along the right vocal cord. CHEST Extensive scattered hypermetabolic adenopathy in the chest. In indistinctly marginated AP window lesion measuring about 2.8 cm in short axis has a maximum SUV of 17.4. Additional prevascular, paratracheal, and subcarinal adenopathy noted. Peripheral interstitial accentuation in both lungs with some mild honeycombing. Left upper lobe subpleural nodule 1.4 by 1.0 cm on image 93/3, maximum SUV 8.4. Coronary, aortic arch, and  branch vessel atherosclerotic vascular disease. ABDOMEN/PELVIS Innumerable masses are present throughout the liver compatible with extensive hepatic metastatic disease. A representative lesion inferiorly in segment 3 has a maximum SUV of 11.9. Numerous layering gallstones in the gallbladder. Suspected hypermetabolic porta hepatis adenopathy with indistinct tissue planes in this vicinity. Scattered activity in the bowel, likely mostly physiologic. There is some focal areas of accentuated activity for example in the sigmoid colon where there is a focus of hypermetabolic activity with maximum SUV 9.3, but without significant CT correlate. Aortoiliac atherosclerotic vascular disease. SKELETON Extensive osseous metastatic disease, with involvement of almost all  vertebral levels, numerous ribs, the iliac bones, the left pubic bones, the bilateral ischium, and in the right intertrochanteric region (no incipient hip fracture identified). An index lytic lesion anteriorly at L4 measures 1.7 cm in diameter and has a maximum standard uptake value of 12.5 generally speaking these lesions are lytic and some are expansile. IMPRESSION: 1. Extensive lytic malignancy in the skeleton with heavy burden of hepatic metastatic lesions, adenopathy in the chest, lower neck, and likely porta hepatis compatible with malignancy ; and a small left upper lobe pulmonary nodule which is likewise hypermetabolic. The bony disease resembles myeloma but it would be unusual to have this much hepatic and soft tissue involvement in the setting of myeloma. Other possibilities might include an unusually aggressive central lung cancer. Tissue diagnosis recommended. 2. AP window mass is indistinct and may be interfering with the recurrent laryngeal nerve. The asymmetric appearance of the cords is probably secondary to this interference. 3. Other imaging findings of potential clinical significance: Coronary, aortic arch, and branch vessel atherosclerotic  vascular disease. Aortoiliac atherosclerotic vascular disease. Cholelithiasis. Electronically Signed   By: Van Clines M.D.   On: 04/10/2016 15:01   US Biopsy  Result Date: 04/16/2016 CLINICAL DATA:  Multiple liver lesions, bone lesions, chest supraclavicular lymphadenopathy. Liver biopsy has been requested to make a tissue diagnosis. EXAM: ULTRASOUND GUIDED CORE BIOPSY OF LIVER MEDICATIONS: 2.0 mg IV Versed; 75 mcg IV Fentanyl Total Moderate Sedation Time: 14 minutes. The patient's level of consciousness and physiologic status were continuously monitored during the procedure by Radiology nursing. PROCEDURE: The procedure, risks, benefits, and alternatives were explained to the patient. Questions regarding the procedure were encouraged and answered. The patient understands and consents to the procedure. A time out was performed prior to initiating the procedure. The abdominal wall was prepped with chlorhexidine in a sterile fashion, and a sterile drape was applied covering the operative field. A sterile gown and sterile gloves were used for the procedure. Local anesthesia was provided with 1% Lidocaine. Ultrasound was performed to localize liver lesions. A lesion within the left lobe was chosen for biopsy. Under ultrasound guidance, a 17 gauge needle was advanced into the left lobe of the liver. Three separate coaxial 18 gauge core biopsy samples were then obtained through the lesion. Samples were submitted in formalin. Upon completion, Gel-Foam pledgets were advanced through the outer needle as it was retracted. COMPLICATIONS: None. FINDINGS: A multitude of rounded hypoechoic masses are seen throughout the liver parenchyma. A lesion within the left lobe was chosen for biopsy and measures 2.2 cm in maximal diameter. Solid tissue was obtained. IMPRESSION: Ultrasound-guided core biopsy performed of a 2.2 cm mass within the left lobe of the liver. Electronically Signed   By: Aletta Edouard M.D.   On:  04/16/2016 16:37   Ir Fluoro Guide Port Insertion Right  Result Date: 04/16/2016 CLINICAL DATA:  Metastatic malignancy likely of lung origin to liver, bones and lymph nodes. The patient requires a porta cath for chemotherapy. EXAM: IMPLANTED PORT A CATH PLACEMENT WITH ULTRASOUND AND FLUOROSCOPIC GUIDANCE ANESTHESIA/SEDATION: 2.0 mg IV Versed; 75 mcg IV Fentanyl Total Moderate Sedation Time:  44 minutes The patient's level of consciousness and physiologic status were continuously monitored during the procedure by Radiology nursing. Additional Medications: 2 g IV Ancef. As antibiotic prophylaxis, Ancef was ordered pre-procedure and administered intravenously within one hour of incision. FLUOROSCOPY TIME:  24 seconds.  4.0 mGy. PROCEDURE: The procedure, risks, benefits, and alternatives were explained to the patient. Questions regarding the  procedure were encouraged and answered. The patient understands and consents to the procedure. A time-out was performed prior to initiating the procedure. Ultrasound was utilized to confirm patency of the right internal jugular vein. The right neck and chest were prepped with chlorhexidine in a sterile fashion, and a sterile drape was applied covering the operative field. Maximum barrier sterile technique with sterile gowns and gloves were used for the procedure. Local anesthesia was provided with 1% lidocaine. After creating a small venotomy incision, a 21 gauge needle was advanced into the right internal jugular vein under direct, real-time ultrasound guidance. Ultrasound image documentation was performed. After securing guidewire access, an 8 Fr dilator was placed. A J-wire was kinked to measure appropriate catheter length. A subcutaneous port pocket was then created along the upper chest wall utilizing sharp and blunt dissection. Cautery was utilized. The pocket was irrigated with sterile saline. A single lumen power injectable port was chosen for placement. The 8 Fr catheter  was tunneled from the port pocket site to the venotomy incision. The port was placed in the pocket. External catheter was trimmed to appropriate length based on guidewire measurement. At the venotomy, an 8 Fr peel-away sheath was placed over a guidewire. The catheter was then placed through the sheath and the sheath removed. Final catheter positioning was confirmed and documented with a fluoroscopic spot image. The port was accessed with a needle and aspirated and flushed with heparinized saline. The access needle was removed. The venotomy and port pocket incisions were closed with subcutaneous 3-0 Monocryl and subcuticular 4-0 Vicryl. Dermabond was applied to both incisions. COMPLICATIONS: COMPLICATIONS None FINDINGS: After catheter placement, the tip lies at the cavo-atrial junction. The catheter aspirates normally and is ready for immediate use. IMPRESSION: Placement of single lumen port a cath via right internal jugular vein. The catheter tip lies at the cavo-atrial junction. A power injectable port a cath was placed and is ready for immediate use. Electronically Signed   By: Aletta Edouard M.D.   On: 04/16/2016 16:42     Assessment and plan- Patient is a 66 y.o. male with a history of newly diagnosed stage IV non-small cell carcinoma of the lung TTF-1 positive Stage IV T1N3M1c with liver, bone and LN metastases  I discussed the results of the pathology with the patient in detail which is consistent with non-small cell lung cancer and given that the tumor is TTF-1 positive is compatible with lung origin. We have sent out [facility] testing which will take a couple of weeks to come back. Patient is acutely symptomatic at this point given his extensive adenopathy as well as heavy disease burden in his liver as well as bones. I recommend palliative chemotherapy with carboplatin AUC 5 along with Alimta 500 mg/m IV every 3 weeks for 4-6 cycles. Patient already has a port in place. We will obtain MRI of the  brain with and without contrast to complete his staging workup. I'm choosing Alimta in addition to carboplatin given that his tumor is TTF-1 positive. I discussed the risks and benefits of chemotherapy including all but not limited to nausea, vomiting, low blood counts, risk of infections. Patient understands and agrees to proceed as planned. We will tentatively plan to start treatment next week. Patient will attend chemotherapy class prior to that. He will start taking folate 1 mg by mouth daily and we will plan to give him B-12 shot with his chemotherapy. Patient understands that chemotherapy is palliative and not curative with anyone to shrink hiquality  of life and longevity.  Patient will also need extreme monthly for his bone metastases.Patient has dentures in place and only a couple of teeth which act as anchors. He is unable to proceed with dental evaluationbecause of cost issues.I did discuss the risks and benefits of Xgeva including allbut not limited to fatigue, hypocalcemia and risk of osteonecrosis of the jaw. Given his extensive bone metastases and financial issues and seeking prior dental care I will proceed with Xgeva at this point. Patient understands the risks versus benefits and agrees to proceed.  Neoplasm-related pain- avid encouraged the patient to use more as needed doses of morphine and his pain is currently not well controlled. I will also start him on prednisone 5 mg by mouth twice a day to help with his pain. I have also prescribed lidocaine patch she can use over the point of tenderness. If his pain is still not well-controlled I will add a long-acting pain medication at that time.   Total face to face encounter time for this patient visit was 45 min. >50% of the time was  spent in counseling and coordination of care.     Visit Diagnosis 1. Lung mass   2. Primary malignant neoplasm of lung metastatic to other site, unspecified laterality ([place])   3. Goals of care,  counseling/discussion   4. Neoplasm related pain      Dr. Randa Evens, MD, MPH [facility] Pager- [phone] 04/20/2016 1:59 PM

## 2016-04-20 NOTE — Progress Notes (Signed)
Here for follow up.pain not completely controlled w current Morphine dose per pt.

## 2016-04-20 NOTE — Progress Notes (Signed)
START ON PATHWAY REGIMEN - Non-Small Cell Lung     A cycle is every 21 days:     Pemetrexed      Carboplatin   **Always confirm dose/schedule in your pharmacy ordering system**    Patient Characteristics: Stage IV Metastatic, Non Squamous, Initial Chemotherapy/Immunotherapy, PS = 0, 1, PD-L1 Expression Positive 1-49% (TPS) / Negative / Not Tested / Not a Candidate for Immunotherapy AJCC T Category: TX Current Disease Status: Distant Metastases AJCC N Category: NX AJCC M Category: M1c AJCC 8 Stage Grouping: IVB Histology: Non Squamous Cell ROS1 Rearrangement Status: Awaiting Test Results T790M Mutation Status: Not Applicable - EGFR Mutation Negative/Unknown Other Mutations/Biomarkers: Yes PD-L1 Expression Status: Quantity Not Sufficient Chemotherapy/Immunotherapy LOT: Initial Chemotherapy/Immunotherapy Molecular Targeted Therapy: Not Appropriate ALK Translocation Status: Awaiting Test Results Would you be surprised if this patient died  in the next year? I would NOT be surprised if this patient died in the next year EGFR Mutation Status: Awaiting Test Results BRAF V600E Mutation Status: Awaiting Test Results Performance Status: PS = 0, 1  Intent of Therapy: Non-Curative / Palliative Intent, Discussed with Patient

## 2016-04-23 ENCOUNTER — Ambulatory Visit
Admission: RE | Admit: 2016-04-23 | Discharge: 2016-04-23 | Disposition: A | Discharge status: Home / Self Care | Payer: Medicare HMO | Source: Ambulatory Visit | Attending: Oncology | Admitting: Oncology

## 2016-04-23 ENCOUNTER — Telehealth: Payer: Self-pay | Admitting: *Deleted

## 2016-04-23 DIAGNOSIS — C7951 Secondary malignant neoplasm of bone: Secondary | ICD-10-CM | POA: Diagnosis not present

## 2016-04-23 DIAGNOSIS — R918 Other nonspecific abnormal finding of lung field: Secondary | ICD-10-CM

## 2016-04-23 DIAGNOSIS — C3490 Malignant neoplasm of unspecified part of unspecified bronchus or lung: Secondary | ICD-10-CM | POA: Diagnosis present

## 2016-04-23 MED ORDER — GADOBENATE DIMEGLUMINE 529 MG/ML IV SOLN
15.0000 mL | Freq: Once | INTRAVENOUS | Status: AC | PRN
  Administered 2016-04-23: 15 mL via INTRAVENOUS

## 2016-04-23 NOTE — Telephone Encounter (Signed)
PA submitted through covermymeds. Awaiting response.

## 2016-04-23 NOTE — Telephone Encounter (Signed)
PA needed for EMLA and Lidoderm

## 2016-04-24 ENCOUNTER — Ambulatory Visit: Payer: Medicare HMO | Admitting: Oncology

## 2016-04-24 ENCOUNTER — Other Ambulatory Visit: Payer: Medicare HMO

## 2016-04-24 NOTE — Patient Instructions (Signed)
Pemetrexed injection What is this medicine? PEMETREXED (PEM e TREX ed) is a chemotherapy drug used to treat lung cancers like non-small cell lung cancer and mesothelioma. It may also be used to treat other cancers. This medicine may be used for other purposes; ask your health care provider or pharmacist if you have questions. COMMON BRAND NAME(S): Alimta What should I tell my health care provider before I take this medicine? They need to know if you have any of these conditions: -infection (especially a virus infection such as chickenpox, cold sores, or herpes) -kidney disease -low blood counts, like low white cell, platelet, or red cell counts -lung or breathing disease, like asthma -radiation therapy -an unusual or allergic reaction to pemetrexed, other medicines, foods, dyes, or preservative -pregnant or trying to get pregnant -breast-feeding How should I use this medicine? This drug is given as an infusion into a vein. It is administered in a hospital or clinic by a specially trained health care professional. Talk to your pediatrician regarding the use of this medicine in children. Special care may be needed. Overdosage: If you think you have taken too much of this medicine contact a poison control center or emergency room at once. NOTE: This medicine is only for you. Do not share this medicine with others. What if I miss a dose? It is important not to miss your dose. Call your doctor or health care professional if you are unable to keep an appointment. What may interact with this medicine? This medicine may interact with the following medications: -Ibuprofen This list may not describe all possible interactions. Give your health care provider a list of all the medicines, herbs, non-prescription drugs, or dietary supplements you use. Also tell them if you smoke, drink alcohol, or use illegal drugs. Some items may interact with your medicine. What should I watch for while using this  medicine? Visit your doctor for checks on your progress. This drug may make you feel generally unwell. This is not uncommon, as chemotherapy can affect healthy cells as well as cancer cells. Report any side effects. Continue your course of treatment even though you feel ill unless your doctor tells you to stop. In some cases, you may be given additional medicines to help with side effects. Follow all directions for their use. Call your doctor or health care professional for advice if you get a fever, chills or sore throat, or other symptoms of a cold or flu. Do not treat yourself. This drug decreases your body's ability to fight infections. Try to avoid being around people who are sick. This medicine may increase your risk to bruise or bleed. Call your doctor or health care professional if you notice any unusual bleeding. Be careful brushing and flossing your teeth or using a toothpick because you may get an infection or bleed more easily. If you have any dental work done, tell your dentist you are receiving this medicine. Avoid taking products that contain aspirin, acetaminophen, ibuprofen, naproxen, or ketoprofen unless instructed by your doctor. These medicines may hide a fever. Call your doctor or health care professional if you get diarrhea or mouth sores. Do not treat yourself. To protect your kidneys, drink water or other fluids as directed while you are taking this medicine. Do not become pregnant while taking this medicine or for 6 months after stopping it. Women should inform their doctor if they wish to become pregnant or think they might be pregnant. Men should not father a child while taking this medicine and   for 3 months after stopping it. This may interfere with the ability to father a child. You should talk to your doctor or health care professional if you are concerned about your fertility. There is a potential for serious side effects to an unborn child. Talk to your health care  professional or pharmacist for more information. Do not breast-feed an infant while taking this medicine or for 1 week after stopping it. What side effects may I notice from receiving this medicine? Side effects that you should report to your doctor or health care professional as soon as possible: -allergic reactions like skin rash, itching or hives, swelling of the face, lips, or tongue -breathing problems -redness, blistering, peeling or loosening of the skin, including inside the mouth -signs and symptoms of bleeding such as bloody or black, tarry stools; red or dark-brown urine; spitting up blood or brown material that looks like coffee grounds; red spots on the skin; unusual bruising or bleeding from the eye, gums, or nose -signs and symptoms of infection like fever or chills; cough; sore throat; pain or trouble passing urine -signs and symptoms of kidney injury like trouble passing urine or change in the amount of urine -signs and symptoms of liver injury like dark yellow or brown urine; general ill feeling or flu-like symptoms; light-colored stools; loss of appetite; nausea; right upper belly pain; unusually weak or tired; yellowing of the eyes or skin Side effects that usually do not require medical attention (report to your doctor or health care professional if they continue or are bothersome): -constipation -dizziness -mouth sores -nausea, vomiting -pain, tingling, numbness in the hands or feet -unusually weak or tired This list may not describe all possible side effects. Call your doctor for medical advice about side effects. You may report side effects to FDA at 1-800-FDA-1088. Where should I keep my medicine? This drug is given in a hospital or clinic and will not be stored at home. NOTE: This sheet is a summary. It may not cover all possible information. If you have questions about this medicine, talk to your doctor, pharmacist, or health care provider.  2018 [employer]  Standard (2015-11-01 18:51:46) Carboplatin injection What is this medicine? CARBOPLATIN (KAR boe pla tin) is a chemotherapy drug. It targets fast dividing cells, like cancer cells, and causes these cells to die. This medicine is used to treat ovarian cancer and many other cancers. This medicine may be used for other purposes; ask your health care provider or pharmacist if you have questions. COMMON BRAND NAME(S): Paraplatin What should I tell my health care provider before I take this medicine? They need to know if you have any of these conditions: -blood disorders -hearing problems -kidney disease -recent or ongoing radiation therapy -an unusual or allergic reaction to carboplatin, cisplatin, other chemotherapy, other medicines, foods, dyes, or preservatives -pregnant or trying to get pregnant -breast-feeding How should I use this medicine? This drug is usually given as an infusion into a vein. It is administered in a hospital or clinic by a specially trained health care professional. Talk to your pediatrician regarding the use of this medicine in children. Special care may be needed. Overdosage: If you think you have taken too much of this medicine contact a poison control center or emergency room at once. NOTE: This medicine is only for you. Do not share this medicine with others. What if I miss a dose? It is important not to miss a dose. Call your doctor or health care professional if you are   unable to keep an appointment. What may interact with this medicine? -medicines for seizures -medicines to increase blood counts like filgrastim, pegfilgrastim, sargramostim -some antibiotics like amikacin, gentamicin, neomycin, streptomycin, tobramycin -vaccines Talk to your doctor or health care professional before taking any of these medicines: -acetaminophen -aspirin -ibuprofen -ketoprofen -naproxen This list may not describe all possible interactions. Give your health care provider a  list of all the medicines, herbs, non-prescription drugs, or dietary supplements you use. Also tell them if you smoke, drink alcohol, or use illegal drugs. Some items may interact with your medicine. What should I watch for while using this medicine? Your condition will be monitored carefully while you are receiving this medicine. You will need important blood work done while you are taking this medicine. This drug may make you feel generally unwell. This is not uncommon, as chemotherapy can affect healthy cells as well as cancer cells. Report any side effects. Continue your course of treatment even though you feel ill unless your doctor tells you to stop. In some cases, you may be given additional medicines to help with side effects. Follow all directions for their use. Call your doctor or health care professional for advice if you get a fever, chills or sore throat, or other symptoms of a cold or flu. Do not treat yourself. This drug decreases your body's ability to fight infections. Try to avoid being around people who are sick. This medicine may increase your risk to bruise or bleed. Call your doctor or health care professional if you notice any unusual bleeding. Be careful brushing and flossing your teeth or using a toothpick because you may get an infection or bleed more easily. If you have any dental work done, tell your dentist you are receiving this medicine. Avoid taking products that contain aspirin, acetaminophen, ibuprofen, naproxen, or ketoprofen unless instructed by your doctor. These medicines may hide a fever. Do not become pregnant while taking this medicine. Women should inform their doctor if they wish to become pregnant or think they might be pregnant. There is a potential for serious side effects to an unborn child. Talk to your health care professional or pharmacist for more information. Do not breast-feed an infant while taking this medicine. What side effects may I notice from  receiving this medicine? Side effects that you should report to your doctor or health care professional as soon as possible: -allergic reactions like skin rash, itching or hives, swelling of the face, lips, or tongue -signs of infection - fever or chills, cough, sore throat, pain or difficulty passing urine -signs of decreased platelets or bleeding - bruising, pinpoint red spots on the skin, black, tarry stools, nosebleeds -signs of decreased red blood cells - unusually weak or tired, fainting spells, lightheadedness -breathing problems -changes in hearing -changes in vision -chest pain -high blood pressure -low blood counts - This drug may decrease the number of white blood cells, red blood cells and platelets. You may be at increased risk for infections and bleeding. -nausea and vomiting -pain, swelling, redness or irritation at the injection site -pain, tingling, numbness in the hands or feet -problems with balance, talking, walking -trouble passing urine or change in the amount of urine Side effects that usually do not require medical attention (report to your doctor or health care professional if they continue or are bothersome): -hair loss -loss of appetite -metallic taste in the mouth or changes in taste This list may not describe all possible side effects. Call your doctor for medical   advice about side effects. You may report side effects to FDA at 1-800-FDA-1088. Where should I keep my medicine? This drug is given in a hospital or clinic and will not be stored at home. NOTE: This sheet is a summary. It may not cover all possible information. If you have questions about this medicine, talk to your doctor, pharmacist, or health care provider.  2018 [employer] Standard (2007-04-08 14:38:05)  

## 2016-04-26 ENCOUNTER — Other Ambulatory Visit: Payer: Self-pay | Admitting: Oncology

## 2016-04-26 ENCOUNTER — Inpatient Hospital Stay: Payer: Medicare HMO

## 2016-04-26 ENCOUNTER — Other Ambulatory Visit: Payer: Self-pay

## 2016-04-26 VITALS — BP 126/79 | HR 67 | Temp 97.0°F | Resp 18

## 2016-04-26 DIAGNOSIS — C3490 Malignant neoplasm of unspecified part of unspecified bronchus or lung: Secondary | ICD-10-CM

## 2016-04-26 DIAGNOSIS — Z5111 Encounter for antineoplastic chemotherapy: Secondary | ICD-10-CM | POA: Diagnosis not present

## 2016-04-26 DIAGNOSIS — R918 Other nonspecific abnormal finding of lung field: Secondary | ICD-10-CM

## 2016-04-26 LAB — CBC WITH DIFFERENTIAL/PLATELET
Basophils Absolute: 0.1 10*3/uL (ref 0–0.1)
Basophils Relative: 1 %
EOS PCT: 0 %
Eosinophils Absolute: 0 10*3/uL (ref 0–0.7)
HCT: 39.6 % — ABNORMAL LOW (ref 40.0–52.0)
Hemoglobin: 13.6 g/dL (ref 13.0–18.0)
LYMPHS ABS: 1.8 10*3/uL (ref 1.0–3.6)
LYMPHS PCT: 14 %
MCH: 31 pg (ref 26.0–34.0)
MCHC: 34.2 g/dL (ref 32.0–36.0)
MCV: 90.6 fL (ref 80.0–100.0)
MONO ABS: 1.4 10*3/uL — AB (ref 0.2–1.0)
Monocytes Relative: 11 %
Neutro Abs: 10 10*3/uL — ABNORMAL HIGH (ref 1.4–6.5)
Neutrophils Relative %: 74 %
PLATELETS: 219 10*3/uL (ref 150–440)
RBC: 4.37 MIL/uL — AB (ref 4.40–5.90)
RDW: 13.6 % (ref 11.5–14.5)
WBC: 13.3 10*3/uL — AB (ref 3.8–10.6)

## 2016-04-26 LAB — COMPREHENSIVE METABOLIC PANEL
ALT: 39 U/L (ref 17–63)
AST: 42 U/L — ABNORMAL HIGH (ref 15–41)
Albumin: 3.9 g/dL (ref 3.5–5.0)
Alkaline Phosphatase: 120 U/L (ref 38–126)
Anion gap: 7 (ref 5–15)
BUN: 21 mg/dL — ABNORMAL HIGH (ref 6–20)
CALCIUM: 10 mg/dL (ref 8.9–10.3)
CHLORIDE: 96 mmol/L — AB (ref 101–111)
CO2: 29 mmol/L (ref 22–32)
CREATININE: 0.82 mg/dL (ref 0.61–1.24)
Glucose, Bld: 97 mg/dL (ref 65–99)
Potassium: 4.5 mmol/L (ref 3.5–5.1)
Sodium: 132 mmol/L — ABNORMAL LOW (ref 135–145)
Total Bilirubin: 0.4 mg/dL (ref 0.3–1.2)
Total Protein: 7.8 g/dL (ref 6.5–8.1)

## 2016-04-26 MED ORDER — SODIUM CHLORIDE 0.9 % IV SOLN
520.0000 mg | Freq: Once | INTRAVENOUS | Status: AC
  Administered 2016-04-26: 520 mg via INTRAVENOUS
  Filled 2016-04-26: qty 52

## 2016-04-26 MED ORDER — DENOSUMAB 120 MG/1.7ML ~~LOC~~ SOLN
120.0000 mg | Freq: Once | SUBCUTANEOUS | Status: AC
  Administered 2016-04-26: 120 mg via SUBCUTANEOUS
  Filled 2016-04-26: qty 1.7

## 2016-04-26 MED ORDER — PALONOSETRON HCL INJECTION 0.25 MG/5ML
0.2500 mg | Freq: Once | INTRAVENOUS | Status: AC
  Administered 2016-04-26: 0.25 mg via INTRAVENOUS
  Filled 2016-04-26: qty 5

## 2016-04-26 MED ORDER — DEXAMETHASONE SODIUM PHOSPHATE 10 MG/ML IJ SOLN
10.0000 mg | Freq: Once | INTRAMUSCULAR | Status: AC
  Administered 2016-04-26: 10 mg via INTRAVENOUS
  Filled 2016-04-26: qty 1

## 2016-04-26 MED ORDER — SODIUM CHLORIDE 0.9 % IV SOLN: 10.0000 mg | Freq: Once | INTRAVENOUS | Status: DC

## 2016-04-26 MED ORDER — CYANOCOBALAMIN 1000 MCG/ML IJ SOLN
1000.0000 ug | Freq: Once | INTRAMUSCULAR | Status: AC
  Administered 2016-04-26: 1000 ug via INTRAMUSCULAR
  Filled 2016-04-26: qty 1

## 2016-04-26 MED ORDER — SODIUM CHLORIDE 0.9 % IV SOLN
Freq: Once | INTRAVENOUS | Status: AC
  Administered 2016-04-26: 12:00:00 via INTRAVENOUS
  Filled 2016-04-26: qty 1000

## 2016-04-26 MED ORDER — HEPARIN SOD (PORK) LOCK FLUSH 100 UNIT/ML IV SOLN
500.0000 [IU] | Freq: Once | INTRAVENOUS | Status: AC | PRN
  Administered 2016-04-26: 500 [IU]
  Filled 2016-04-26: qty 5

## 2016-04-26 MED ORDER — SODIUM CHLORIDE 0.9 % IV SOLN
500.0000 mg/m2 | Freq: Once | INTRAVENOUS | Status: AC
  Administered 2016-04-26: 1000 mg via INTRAVENOUS
  Filled 2016-04-26: qty 40

## 2016-04-27 ENCOUNTER — Telehealth: Payer: Self-pay | Admitting: *Deleted

## 2016-04-27 LAB — CEA: CEA: 7641 ng/mL — ABNORMAL HIGH (ref 0.0–4.7)

## 2016-04-27 NOTE — Telephone Encounter (Signed)
I rcvd papers for PA for lidoderm patch and lidocaine cream and I was told that one of the nurses while I was on vacation had got med approved but I got PA form faxed to me to do. I called the pharmacy and was told that both meds are approved and there is no need to do PA form.

## 2016-04-27 NOTE — Telephone Encounter (Signed)
Called pt to follow up after he received chemo yesterday. Pt states is feeling the same and has not noticed any side effects from treatment at this time. Informed pt to call if needs anything else. No further needs at this time.

## 2016-05-03 ENCOUNTER — Inpatient Hospital Stay: Payer: Medicare HMO

## 2016-05-03 ENCOUNTER — Other Ambulatory Visit: Payer: Self-pay | Admitting: Oncology

## 2016-05-03 ENCOUNTER — Inpatient Hospital Stay (HOSPITAL_BASED_OUTPATIENT_CLINIC_OR_DEPARTMENT_OTHER): Payer: Medicare HMO | Admitting: Oncology

## 2016-05-03 VITALS — BP 116/76 | HR 85 | Temp 95.7°F | Resp 18 | Wt 164.2 lb

## 2016-05-03 DIAGNOSIS — Z7984 Long term (current) use of oral hypoglycemic drugs: Secondary | ICD-10-CM

## 2016-05-03 DIAGNOSIS — R634 Abnormal weight loss: Secondary | ICD-10-CM

## 2016-05-03 DIAGNOSIS — F17210 Nicotine dependence, cigarettes, uncomplicated: Secondary | ICD-10-CM | POA: Diagnosis not present

## 2016-05-03 DIAGNOSIS — C3412 Malignant neoplasm of upper lobe, left bronchus or lung: Secondary | ICD-10-CM | POA: Diagnosis not present

## 2016-05-03 DIAGNOSIS — J3801 Paralysis of vocal cords and larynx, unilateral: Secondary | ICD-10-CM

## 2016-05-03 DIAGNOSIS — I700 Atherosclerosis of aorta: Secondary | ICD-10-CM | POA: Diagnosis not present

## 2016-05-03 DIAGNOSIS — I2510 Atherosclerotic heart disease of native coronary artery without angina pectoris: Secondary | ICD-10-CM

## 2016-05-03 DIAGNOSIS — C3490 Malignant neoplasm of unspecified part of unspecified bronchus or lung: Secondary | ICD-10-CM

## 2016-05-03 DIAGNOSIS — R590 Localized enlarged lymph nodes: Secondary | ICD-10-CM

## 2016-05-03 DIAGNOSIS — G893 Neoplasm related pain (acute) (chronic): Secondary | ICD-10-CM | POA: Diagnosis not present

## 2016-05-03 DIAGNOSIS — R918 Other nonspecific abnormal finding of lung field: Secondary | ICD-10-CM

## 2016-05-03 DIAGNOSIS — Z5111 Encounter for antineoplastic chemotherapy: Secondary | ICD-10-CM | POA: Diagnosis not present

## 2016-05-03 DIAGNOSIS — Z79899 Other long term (current) drug therapy: Secondary | ICD-10-CM | POA: Diagnosis not present

## 2016-05-03 DIAGNOSIS — C7951 Secondary malignant neoplasm of bone: Secondary | ICD-10-CM | POA: Diagnosis not present

## 2016-05-03 DIAGNOSIS — C787 Secondary malignant neoplasm of liver and intrahepatic bile duct: Secondary | ICD-10-CM

## 2016-05-03 DIAGNOSIS — R5383 Other fatigue: Secondary | ICD-10-CM | POA: Diagnosis not present

## 2016-05-03 DIAGNOSIS — E119 Type 2 diabetes mellitus without complications: Secondary | ICD-10-CM

## 2016-05-03 DIAGNOSIS — G4730 Sleep apnea, unspecified: Secondary | ICD-10-CM

## 2016-05-03 DIAGNOSIS — K8020 Calculus of gallbladder without cholecystitis without obstruction: Secondary | ICD-10-CM

## 2016-05-03 LAB — CBC WITH DIFFERENTIAL/PLATELET
Basophils Absolute: 0 10*3/uL (ref 0–0.1)
Basophils Relative: 0 %
EOS ABS: 0.1 10*3/uL (ref 0–0.7)
EOS PCT: 2 %
HEMATOCRIT: 44.2 % (ref 40.0–52.0)
Hemoglobin: 15 g/dL (ref 13.0–18.0)
LYMPHS PCT: 40 %
Lymphs Abs: 2.4 10*3/uL (ref 1.0–3.6)
MCH: 31.7 pg (ref 26.0–34.0)
MCHC: 34 g/dL (ref 32.0–36.0)
MCV: 93 fL (ref 80.0–100.0)
MONO ABS: 0.3 10*3/uL (ref 0.2–1.0)
MONOS PCT: 5 %
Neutro Abs: 3.1 10*3/uL (ref 1.4–6.5)
Neutrophils Relative %: 53 %
Platelets: 161 10*3/uL (ref 150–440)
RBC: 4.75 MIL/uL (ref 4.40–5.90)
RDW: 13.7 % (ref 11.5–14.5)
WBC: 5.9 10*3/uL (ref 3.8–10.6)

## 2016-05-03 LAB — COMPREHENSIVE METABOLIC PANEL
ALK PHOS: 108 U/L (ref 38–126)
ALT: 41 U/L (ref 17–63)
ANION GAP: 5 (ref 5–15)
AST: 33 U/L (ref 15–41)
Albumin: 4.2 g/dL (ref 3.5–5.0)
BILIRUBIN TOTAL: 1.2 mg/dL (ref 0.3–1.2)
BUN: 20 mg/dL (ref 6–20)
CALCIUM: 8.4 mg/dL — AB (ref 8.9–10.3)
CO2: 31 mmol/L (ref 22–32)
Chloride: 96 mmol/L — ABNORMAL LOW (ref 101–111)
Creatinine, Ser: 0.87 mg/dL (ref 0.61–1.24)
GFR calc non Af Amer: >60 mL/min (ref >60)
Glucose, Bld: 103 mg/dL — ABNORMAL HIGH (ref 65–99)
Potassium: 4.4 mmol/L (ref 3.5–5.1)
SODIUM: 132 mmol/L — AB (ref 135–145)
TOTAL PROTEIN: 7.3 g/dL (ref 6.5–8.1)

## 2016-05-03 MED ORDER — CHLORPROMAZINE HCL 25 MG PO TABS: 25.0000 mg | ORAL_TABLET | Freq: Four times a day (QID) | ORAL | 0 refills | Status: DC | PRN

## 2016-05-03 NOTE — Progress Notes (Signed)
Hematology/Oncology Consult note [facility]  Telephone:([phone] Fax:([phone]  Patient Care Team: Gennette Pac, FNP as PCP - General (Family Medicine)   Name of the patient: Austin Bryan  [id number]  09-Mar-1950   Date of visit: 05/03/16  Diagnosis- Stage Iv lung cancer with bone and liver mets  Chief complaint/ Reason for visit- assess tolerance to cycle # 1 of chemotherapy  Heme/Onc history: 1. Patient is a 66 year old male who was seen by Dr. walk from ENT for symptoms of hoarseness of voice for 1 month. 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 Patient is a chronic smoker and has smoked about 1-1/2 packs per day for more than 30 years. He has lost about 17 pounds of weight over the last 6-7 months. Patient lives with his sister and is independent of hisADLs and IADLs.  2. CT soft tissue of the neck on 04/02/2016 showed: 1. Soft tissue mass along the undersurface of the aortic arch is most concerning for a metastatic adenopathy. This likely impacts the left recurrent laryngeal nerve leading to vocal cord paralysis. 2. Extensive mediastinal adenopathy compatible with metastatic disease. 3. Numerous lytic lesions throughout the visualized is cervical and thoracic spine as well is bilateral ribs. These are compatible with additional metastases. 4. 12 mm pleural-based nodule in the left upper lid may represent a primary bronchogenic neoplasm. This is more likely metastatic disease. 5. Findings are most concerning for a primary lung cancer. Recommend CT of the chest, abdomen, and pelvis with contrast for further evaluation of the primary neoplasm and extent of metastatic disease.  3. PET CT scan on 04/10/2016 showed:IMPRESSION:1. Extensive lytic malignancy in the skeleton with heavy burden of hepatic metastatic lesions, adenopathy in the chest, lower neck, and likely porta hepatis compatible with malignancy ; and a small left upper lobe  pulmonary nodule which is likewise hypermetabolic. The bony disease resembles myeloma but it would be unusual to have this much hepatic and soft tissue involvement in the setting of myeloma. Other possibilities might include an unusually aggressive central lung cancer. Tissue diagnosis recommended. 2. AP window mass is indistinct and may be interfering with the recurrent laryngeal nerve. The asymmetric appearance of the cords is probably secondary to this interference. 3. 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 Cholelithiasis.  4. US guided liver biopsy showed: DIAGNOSIS:  A. LIVER MASS, LEFT LOBE; ULTRASOUND GUIDED BIOPSY:  - METASTATIC NON-SMALL CELL CARCINOMA, TTF-1 POSITIVE.   Comment:  A limited panel of immunohistochemical stains was performed. The  metastatic malignant neoplasm demonstrates the following pattern of  immunoreactivity:  Super pancytokeratin: Positive  TTF-1: Positive  CD56: Negative  CDX-2: Negative  Stain controls worked appropriately. The morphologic findings are  consistent with metastatic poorly differentiated carcinoma. The presence  of TTF-1 positivity is compatible with lung origin.   5. Cycle 1 carbo/alimta given on 04/26/16. He gets monthly X geva. CEA elevated at 7641 at baseline  Interval history- he tolerated cycle #1 of chemotherapy well without any significant nausea or vomiting. Does report fatigue and is unable to carry out activities such as mopping the floor. Pain in his right upper quadrant and along the right chest wall is better controlled with pain medications. Lidocaine patch is helping. He is using Glucerna for nutritional support however he has lost 5 pounds of weight over the last 3 weeks  ECOG PS- 2 Pain scale- 7  Opioid associated constipation- no  Review of systems- Review of Systems  Constitutional:  Positive for malaise/fatigue. Negative for chills, fever and weight loss.  HENT: Negative for congestion, ear discharge and nosebleeds.        Hoarseness of voice +  Eyes: Negative for blurred vision.  Respiratory: Negative for cough, hemoptysis, sputum production, shortness of breath and wheezing.        Chest wall pain  Cardiovascular: Negative for chest pain, palpitations, orthopnea and claudication.  Gastrointestinal: Negative for abdominal pain, blood in stool, constipation, diarrhea, heartburn, melena, nausea and vomiting.  Genitourinary: Negative for dysuria, flank pain, frequency, hematuria and urgency.  Musculoskeletal: Negative for back pain, joint pain and myalgias.  Skin: Negative for rash.  Neurological: Negative for dizziness, tingling, focal weakness, seizures, weakness and headaches.  Endo/Heme/Allergies: Does not bruise/bleed easily.  Psychiatric/Behavioral: Negative for depression and suicidal ideas. The patient does not have insomnia.       No Known Allergies   Past Medical History:  Diagnosis Date  . Allergy   . Diabetes mellitus without complication ([place])   . Dyspnea      Past Surgical History:  Procedure Laterality Date  . ADENOIDECTOMY    . COLONOSCOPY    . IR FLUORO GUIDE PORT INSERTION RIGHT  04/16/2016  . TONSILLECTOMY      Social History   Social History  . Marital status: Divorced    Spouse name: N/A  . Number of children: N/A  . Years of education: N/A   Occupational History  . Not on file.   Social History Main Topics  . Smoking status: Current Every Day Smoker    Packs/day: 1.00    Years: 40.00    Types: Cigarettes  . Smokeless tobacco: Never Used  . Alcohol use No  . Drug use: No  . Sexual activity: Not on file   Other Topics Concern  . Not on file   Social History Narrative  . No narrative on file    Family History  Problem Relation Age of Onset  . Cancer Mother   . Cancer Father      Current Outpatient  Prescriptions:  .  dexamethasone (DECADRON) 4 MG tablet, Take 1 tab two times a day the day before Alimta chemo. Take 2 tabs two times a day starting the day after chemo for 3 days., Disp: 30 tablet, Rfl: 1 .  fexofenadine (ALLEGRA) 180 MG tablet, Take 1 tablet (180 mg total) by mouth daily., Disp: 90 tablet, Rfl: 2 .  fluticasone (FLONASE) 50 MCG/ACT nasal spray, Place 2 sprays into both nostrils daily., Disp: , Rfl:  .  folic acid (FOLVITE) 1 MG tablet, Take 1 tablet (1 mg total) by mouth daily. Start 5-7 days before Alimta chemotherapy. Continue until 21 days after Alimta completed., Disp: 100 tablet, Rfl: 3 .  lidocaine (LIDODERM) 5 %, Place 1 patch onto the skin daily. Remove & Discard patch within 12 hours or as directed by MD, Disp: 30 patch, Rfl: 0 .  lidocaine-prilocaine (EMLA) cream, Apply to affected area once, Disp: 30 g, Rfl: 3 .  lidocaine-prilocaine (EMLA) cream, Apply to affected area once, Disp: 30 g, Rfl: 3 .  LORazepam (ATIVAN) 0.5 MG tablet, Take 1 tablet (0.5 mg total) by mouth every 6 (six) hours as needed (Nausea or vomiting)., Disp: 30 tablet, Rfl: 0 .  metFORMIN (GLUCOPHAGE) 500 MG tablet, Take 1 tablet (500 mg total) by mouth 2 (two) times daily with a meal., Disp: 180 tablet, Rfl: 2 .  Morphine Sulfate (MORPHINE CONCENTRATE) 10 mg / 0.5 ml concentrated solution, Take 0.25 mLs (5 mg total) by mouth every 4 (four) hours as needed for severe pain., Disp: 30 mL, Rfl: 0 .  ondansetron (ZOFRAN) 8 MG tablet, Take 1 tablet (8 mg total) by mouth 2 (two) times daily as needed for refractory nausea / vomiting. Start on day 3 after chemo., Disp: 30 tablet, Rfl: 1 .  predniSONE (DELTASONE) 5 MG tablet, Take 1 tablet (5 mg total) by mouth 2 (two) times daily with a meal., Disp: 28 tablet, Rfl: 0 .  prochlorperazine (COMPAZINE) 10 MG tablet, Take 1 tablet (10 mg total) by mouth every 6 (six) hours as needed (Nausea or vomiting)., Disp: 30 tablet, Rfl: 1 .  ranitidine (ZANTAC) 150 MG tablet,  Take 1 tablet (150 mg total) by mouth 2 (two) times daily., Disp: 180 tablet, Rfl: 2  Physical exam:  Vitals:   05/03/16 1138  BP: 116/76  Pulse: 85  Resp: 18  Temp: (!) 95.7 F (35.4 C)  TempSrc: Tympanic  Weight: 164 lb 3.2 oz (74.5 kg)   Physical Exam  Constitutional: He is oriented to person, place, and time.  Thin, fatigued, in no acute distress  HENT:  Head: Normocephalic and atraumatic.  Eyes: EOM are normal. Pupils are equal, round, and reactive to light.  Neck: Normal range of motion.  Cardiovascular: Normal rate, regular rhythm and normal heart sounds.   Pulmonary/Chest: Effort normal and breath sounds normal.  Abdominal: Soft. Bowel sounds are normal.  Neurological: He is alert and oriented to person, place, and time.  Skin: Skin is warm and dry.     CMP Latest Ref Rng & Units 04/26/2016  Glucose 65 - 99 mg/dL 97  BUN 6 - 20 mg/dL 21(H)  Creatinine 0.61 - 1.24 mg/dL 0.82  Sodium 135 - 145 mmol/L 132(L)  Potassium 3.5 - 5.1 mmol/L 4.5  Chloride 101 - 111 mmol/L 96(L)  CO2 22 - 32 mmol/L 29  Calcium 8.9 - 10.3 mg/dL 10.0  Total Protein 6.5 - 8.1 g/dL 7.8  Total Bilirubin 0.3 - 1.2 mg/dL 0.4  Alkaline Phos 38 - 126 U/L 120  AST 15 - 41 U/L 42(H)  ALT 17 - 63 U/L 39   CBC Latest Ref Rng & Units 04/26/2016  WBC 3.8 - 10.6 K/uL 13.3(H)  Hemoglobin 13.0 - 18.0 g/dL 13.6  Hematocrit 40.0 - 52.0 % 39.6(L)  Platelets 150 - 440 K/uL 219    No images are attached to the encounter.  Mr Jeri Cos Wo Contrast  Result Date: 04/23/2016 CLINICAL DATA:  Lung cancer with metastatic disease. EXAM: MRI HEAD WITHOUT AND WITH CONTRAST TECHNIQUE: Multiplanar, multiecho pulse sequences of the brain and surrounding structures were obtained without and with intravenous contrast. CONTRAST:  60m MULTIHANCE GADOBENATE DIMEGLUMINE 529 MG/ML IV SOLN COMPARISON:  PET scan 04/10/2016 FINDINGS: Brain: No acute infarct, hemorrhage, or mass lesion is present. The ventricles are of normal size.  Mild atrophy is within normal limits for age. No significant white matter disease is present. No significant extraaxial fluid collection is present. The internal auditory canals are within normal limits bilaterally. The brainstem and cerebellum are normal. Vascular: Normal flow voids. Skull and upper cervical spine: Multiple enhancing calvarial lesions are present. Ileus 3 separate lesions are present within the midline occipital bone. The largest 2 lesions measure 10 and 7 mm respectively. At least 2 lesions are present in the right parietal bone measuring up to 13 mm. A left frontal lesion  on image 47 of series 14 measures 10 mm. Other smaller calvarial lesions are present as well. Heterogeneous signal in the upper cervical spine is worrisome for metastatic disease. The clivus is intact. Sinuses/Orbits: The right maxillary sinus is shrunken, compatible with chronic disease. No active disease is present. The paranasal sinuses are otherwise clear. The mastoid air cells are clear bilaterally. The globes and orbits are within normal limits bilaterally. IMPRESSION: 1. Diffuse osseous metastases involving the skull and upper cervical spine as described. 2. No evidence for metastatic disease to the brain. 3. 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 Electronically Signed   By: San Morelle M.D.   On: 04/23/2016 09:05   Nm Pet Image Initial (pi) Skull Base To Thigh  Result Date: 04/10/2016 CLINICAL DATA:  Initial Treatment strategy for lung mass. Left vocal cord paralysis. Bony lesions on recent CT. Adenopathy. EXAM: NUCLEAR MEDICINE PET SKULL BASE TO THIGH TECHNIQUE: 13.0 mCi F-18 FDG was injected intravenously. Full-ring PET imaging was performed from the skull base to thigh after the radiotracer. CT data was obtained and used for attenuation correction and anatomic localization. FASTING BLOOD GLUCOSE:  Value: 78 mg/dl COMPARISON:  CT neck  04/02/2016 FINDINGS: NECK Bilateral shoddy hypermetabolic supraclavicular adenopathy. An 8 mm right level 4 lymph node has a maximum SUV of 7.3. Subtle increased signal along the right vocal cord. CHEST Extensive scattered hypermetabolic adenopathy in the chest. In indistinctly marginated AP window lesion measuring about 2.8 cm in short axis has a maximum SUV of 17.4. Additional prevascular, paratracheal, and subcarinal adenopathy noted. Peripheral interstitial accentuation in both lungs with some mild honeycombing. Left upper lobe subpleural nodule 1.4 by 1.0 cm on image 93/3, maximum SUV 8.4. Coronary, aortic arch, and branch vessel atherosclerotic vascular disease. ABDOMEN/PELVIS Innumerable masses are present throughout the liver compatible with extensive hepatic metastatic disease. A representative lesion inferiorly in segment 3 has a maximum SUV of 11.9. Numerous layering gallstones in the gallbladder. Suspected hypermetabolic porta hepatis adenopathy with indistinct tissue planes in this vicinity. Scattered activity in the bowel, likely mostly physiologic. There is some focal areas of accentuated activity for example in the sigmoid colon where there is a focus of hypermetabolic activity with maximum SUV 9.3, but without significant CT correlate. Aortoiliac atherosclerotic vascular disease. SKELETON Extensive osseous metastatic disease, with involvement of almost all vertebral levels, numerous ribs, the iliac bones, the left pubic bones, the bilateral ischium, and in the right intertrochanteric region (no incipient hip fracture identified). An index lytic lesion anteriorly at L4 measures 1.7 cm in diameter and has a maximum standard uptake value of 12.5 generally speaking these lesions are lytic and some are expansile. IMPRESSION: 1. Extensive lytic malignancy in the skeleton with heavy burden of hepatic metastatic lesions, adenopathy in the chest, lower neck, and likely porta hepatis compatible with  malignancy ; and a small left upper lobe pulmonary nodule which is likewise hypermetabolic. The bony disease resembles myeloma but it would be unusual to have this much hepatic and soft tissue involvement in the setting of myeloma. Other possibilities might include an unusually aggressive central lung cancer. Tissue diagnosis recommended. 2. AP window mass is indistinct and may be interfering with the recurrent laryngeal nerve. The asymmetric appearance of the cords is probably secondary to this interference. 3. 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 Cholelithiasis. Electronically Signed   By: Van Clines  M.D.   On: 04/10/2016 15:01   US Biopsy  Result Date: 04/16/2016 CLINICAL DATA:  Multiple liver lesions, bone lesions, chest supraclavicular lymphadenopathy. Liver biopsy has been requested to make a tissue diagnosis. EXAM: ULTRASOUND GUIDED CORE BIOPSY OF LIVER MEDICATIONS: 2.0 mg IV Versed; 75 mcg IV Fentanyl Total Moderate Sedation Time: 14 minutes. The patient's level of consciousness and physiologic status were continuously monitored during the procedure by Radiology nursing. PROCEDURE: The procedure, risks, benefits, and alternatives were explained to the patient. Questions regarding the procedure were encouraged and answered. The patient understands and consents to the procedure. A time out was performed prior to initiating the procedure. The abdominal wall was prepped with chlorhexidine in a sterile fashion, and a sterile drape was applied covering the operative field. A sterile gown and sterile gloves were used for the procedure. Local anesthesia was provided with 1% Lidocaine. Ultrasound was performed to localize liver lesions. A lesion within the left lobe was chosen for biopsy. Under ultrasound guidance, a 17 gauge needle was advanced into the left lobe of  the liver. Three separate coaxial 18 gauge core biopsy samples were then obtained through the lesion. Samples were submitted in formalin. Upon completion, Gel-Foam pledgets were advanced through the outer needle as it was retracted. COMPLICATIONS: None. FINDINGS: A multitude of rounded hypoechoic masses are seen throughout the liver parenchyma. A lesion within the left lobe was chosen for biopsy and measures 2.2 cm in maximal diameter. Solid tissue was obtained. IMPRESSION: Ultrasound-guided core biopsy performed of a 2.2 cm mass within the left lobe of the liver. Electronically Signed   By: Aletta Edouard M.D.   On: 04/16/2016 16:37   Ir Fluoro Guide Port Insertion Right  Result Date: 04/16/2016 CLINICAL DATA:  Metastatic malignancy likely of lung origin to liver, bones and lymph nodes. The patient requires a porta cath for chemotherapy. EXAM: IMPLANTED PORT A CATH PLACEMENT WITH ULTRASOUND AND FLUOROSCOPIC GUIDANCE ANESTHESIA/SEDATION: 2.0 mg IV Versed; 75 mcg IV Fentanyl Total Moderate Sedation Time:  44 minutes The patient's level of consciousness and physiologic status were continuously monitored during the procedure by Radiology nursing. Additional Medications: 2 g IV Ancef. As antibiotic prophylaxis, Ancef was ordered pre-procedure and administered intravenously within one hour of incision. FLUOROSCOPY TIME:  24 seconds.  4.0 mGy. PROCEDURE: The procedure, risks, benefits, and alternatives were explained to the patient. Questions regarding the procedure were encouraged and answered. The patient understands and consents to the procedure. A time-out was performed prior to initiating the procedure. Ultrasound was utilized to confirm patency of the right internal jugular vein. The right neck and chest were prepped with chlorhexidine in a sterile fashion, and a sterile drape was applied covering the operative field. Maximum barrier sterile technique with sterile gowns and gloves were used for the procedure.  Local anesthesia was provided with 1% lidocaine. After creating a small venotomy incision, a 21 gauge needle was advanced into the right internal jugular vein under direct, real-time ultrasound guidance. Ultrasound image documentation was performed. After securing guidewire access, an 8 Fr dilator was placed. A J-wire was kinked to measure appropriate catheter length. A subcutaneous port pocket was then created along the upper chest wall utilizing sharp and blunt dissection. Cautery was utilized. The pocket was irrigated with sterile saline. A single lumen power injectable port was chosen for placement. The 8 Fr catheter was tunneled from the port pocket site to the venotomy incision. The port was placed in the pocket. External catheter was trimmed to appropriate length  based on guidewire measurement. At the venotomy, an 8 Fr peel-away sheath was placed over a guidewire. The catheter was then placed through the sheath and the sheath removed. Final catheter positioning was confirmed and documented with a fluoroscopic spot image. The port was accessed with a needle and aspirated and flushed with heparinized saline. The access needle was removed. The venotomy and port pocket incisions were closed with subcutaneous 3-0 Monocryl and subcuticular 4-0 Vicryl. Dermabond was applied to both incisions. COMPLICATIONS: COMPLICATIONS None FINDINGS: After catheter placement, the tip lies at the cavo-atrial junction. The catheter aspirates normally and is ready for immediate use. IMPRESSION: Placement of single lumen port a cath via right internal jugular vein. The catheter tip lies at the cavo-atrial junction. A power injectable port a cath was placed and is ready for immediate use. Electronically Signed   By: Aletta Edouard M.D.   On: 04/16/2016 16:42     Assessment and plan- Patient is a 66 y.o. 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 Cycle #  2 of chemotherapy due on 05/18/16. xgeva due a week later. He will see me on 5/3 and get his chemo on 5/4.   2. Neoplasm related pain- does not desire any changes to his pain meds. Continue prn morphine and lidocaine patch  3. Hoarseness of voice and vocal cord paralysis- from adenopathy compressing the RLN. Hopefully gets better with chemo  Visit Diagnosis 1. Primary malignant neoplasm of lung metastatic to other site, unspecified laterality ([place])   2. Bone metastases ([place])   3. Liver metastases ([place])   4. Neoplasm related pain      Dr. Randa Evens, MD, MPH [facility] Pager- [phone] 05/03/2016 12:09 PM

## 2016-05-03 NOTE — Progress Notes (Signed)
Here for follow up

## 2016-05-04 ENCOUNTER — Ambulatory Visit: Payer: Medicare HMO | Admitting: Oncology

## 2016-05-04 ENCOUNTER — Other Ambulatory Visit: Payer: Medicare HMO

## 2016-05-05 ENCOUNTER — Other Ambulatory Visit: Payer: Self-pay | Admitting: *Deleted

## 2016-05-05 DIAGNOSIS — C3490 Malignant neoplasm of unspecified part of unspecified bronchus or lung: Secondary | ICD-10-CM

## 2016-05-11 ENCOUNTER — Other Ambulatory Visit: Payer: Self-pay | Admitting: Oncology

## 2016-05-11 ENCOUNTER — Encounter: Payer: Self-pay | Admitting: Oncology

## 2016-05-16 ENCOUNTER — Encounter: Payer: Self-pay | Admitting: Oncology

## 2016-05-17 ENCOUNTER — Encounter: Payer: Self-pay | Admitting: Oncology

## 2016-05-17 ENCOUNTER — Inpatient Hospital Stay: Payer: Medicare HMO | Attending: Oncology | Admitting: Oncology

## 2016-05-17 ENCOUNTER — Inpatient Hospital Stay: Payer: Medicare HMO

## 2016-05-17 VITALS — BP 97/74 | HR 93 | Temp 96.5°F | Resp 18 | Wt 162.2 lb

## 2016-05-17 DIAGNOSIS — C787 Secondary malignant neoplasm of liver and intrahepatic bile duct: Secondary | ICD-10-CM

## 2016-05-17 DIAGNOSIS — K5903 Drug induced constipation: Secondary | ICD-10-CM

## 2016-05-17 DIAGNOSIS — F17210 Nicotine dependence, cigarettes, uncomplicated: Secondary | ICD-10-CM | POA: Diagnosis not present

## 2016-05-17 DIAGNOSIS — J3801 Paralysis of vocal cords and larynx, unilateral: Secondary | ICD-10-CM

## 2016-05-17 DIAGNOSIS — C3490 Malignant neoplasm of unspecified part of unspecified bronchus or lung: Secondary | ICD-10-CM | POA: Diagnosis not present

## 2016-05-17 DIAGNOSIS — Z5112 Encounter for antineoplastic immunotherapy: Secondary | ICD-10-CM | POA: Diagnosis not present

## 2016-05-17 DIAGNOSIS — G893 Neoplasm related pain (acute) (chronic): Secondary | ICD-10-CM | POA: Insufficient documentation

## 2016-05-17 DIAGNOSIS — Z79899 Other long term (current) drug therapy: Secondary | ICD-10-CM | POA: Insufficient documentation

## 2016-05-17 DIAGNOSIS — T402X5A Adverse effect of other opioids, initial encounter: Secondary | ICD-10-CM | POA: Insufficient documentation

## 2016-05-17 DIAGNOSIS — R590 Localized enlarged lymph nodes: Secondary | ICD-10-CM | POA: Diagnosis not present

## 2016-05-17 DIAGNOSIS — M899 Disorder of bone, unspecified: Secondary | ICD-10-CM | POA: Diagnosis not present

## 2016-05-17 DIAGNOSIS — I2510 Atherosclerotic heart disease of native coronary artery without angina pectoris: Secondary | ICD-10-CM | POA: Diagnosis not present

## 2016-05-17 DIAGNOSIS — K5900 Constipation, unspecified: Secondary | ICD-10-CM | POA: Insufficient documentation

## 2016-05-17 DIAGNOSIS — E119 Type 2 diabetes mellitus without complications: Secondary | ICD-10-CM | POA: Diagnosis not present

## 2016-05-17 DIAGNOSIS — Z7984 Long term (current) use of oral hypoglycemic drugs: Secondary | ICD-10-CM | POA: Insufficient documentation

## 2016-05-17 DIAGNOSIS — C7951 Secondary malignant neoplasm of bone: Secondary | ICD-10-CM | POA: Insufficient documentation

## 2016-05-17 DIAGNOSIS — I700 Atherosclerosis of aorta: Secondary | ICD-10-CM | POA: Insufficient documentation

## 2016-05-17 DIAGNOSIS — C779 Secondary and unspecified malignant neoplasm of lymph node, unspecified: Secondary | ICD-10-CM | POA: Insufficient documentation

## 2016-05-17 DIAGNOSIS — K8020 Calculus of gallbladder without cholecystitis without obstruction: Secondary | ICD-10-CM | POA: Insufficient documentation

## 2016-05-17 DIAGNOSIS — Z5111 Encounter for antineoplastic chemotherapy: Secondary | ICD-10-CM | POA: Diagnosis not present

## 2016-05-17 LAB — COMPREHENSIVE METABOLIC PANEL
ALT: 34 U/L (ref 17–63)
ANION GAP: 7 (ref 5–15)
AST: 33 U/L (ref 15–41)
Albumin: 3.9 g/dL (ref 3.5–5.0)
Alkaline Phosphatase: 113 U/L (ref 38–126)
BUN: 11 mg/dL (ref 6–20)
CHLORIDE: 99 mmol/L — AB (ref 101–111)
CO2: 30 mmol/L (ref 22–32)
Calcium: 9.1 mg/dL (ref 8.9–10.3)
Creatinine, Ser: 0.76 mg/dL (ref 0.61–1.24)
GFR calc non Af Amer: >60 mL/min (ref >60)
Glucose, Bld: 100 mg/dL — ABNORMAL HIGH (ref 65–99)
POTASSIUM: 4.3 mmol/L (ref 3.5–5.1)
SODIUM: 136 mmol/L (ref 135–145)
Total Bilirubin: 0.6 mg/dL (ref 0.3–1.2)
Total Protein: 7.1 g/dL (ref 6.5–8.1)

## 2016-05-17 LAB — CBC WITH DIFFERENTIAL/PLATELET
Basophils Absolute: 0 10*3/uL (ref 0–0.1)
Basophils Relative: 1 %
EOS ABS: 0.1 10*3/uL (ref 0–0.7)
EOS PCT: 1 %
HCT: 42.2 % (ref 40.0–52.0)
Hemoglobin: 14.4 g/dL (ref 13.0–18.0)
LYMPHS ABS: 1.6 10*3/uL (ref 1.0–3.6)
Lymphocytes Relative: 27 %
MCH: 31.7 pg (ref 26.0–34.0)
MCHC: 34.2 g/dL (ref 32.0–36.0)
MCV: 92.7 fL (ref 80.0–100.0)
Monocytes Absolute: 0.9 10*3/uL (ref 0.2–1.0)
Monocytes Relative: 16 %
Neutro Abs: 3.2 10*3/uL (ref 1.4–6.5)
Neutrophils Relative %: 55 %
PLATELETS: 287 10*3/uL (ref 150–440)
RBC: 4.55 MIL/uL (ref 4.40–5.90)
RDW: 15 % — ABNORMAL HIGH (ref 11.5–14.5)
WBC: 5.8 10*3/uL (ref 3.8–10.6)

## 2016-05-17 MED ORDER — MORPHINE SULFATE ER 15 MG PO TBCR: 15.0000 mg | EXTENDED_RELEASE_TABLET | Freq: Two times a day (BID) | ORAL | 0 refills | Status: DC

## 2016-05-17 MED ORDER — SENNA 8.6 MG PO TABS: 2.0000 | ORAL_TABLET | Freq: Every day | ORAL | 0 refills | Status: DC

## 2016-05-17 NOTE — Progress Notes (Signed)
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 Patient is a 66 year old male who was seen by Dr. walk from ENT for symptoms of hoarseness of voice for 1 month. 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 Patient is a chronic smoker and has smoked about 1-1/2 packs per day for more than 30 years. He has lost about 17 pounds of weight over the last 6-7 months. Patient lives with his sister and is independent of hisADLs and IADLs.  2. CT soft tissue of the neck on 04/02/2016 showed: 1. Soft tissue mass along the undersurface of the aortic arch is most concerning for a metastatic adenopathy. This likely impacts the left recurrent laryngeal nerve leading to vocal cord paralysis. 2. Extensive mediastinal adenopathy compatible with metastatic disease. 3. Numerous lytic lesions throughout the visualized is cervical and thoracic spine as well is bilateral ribs. These are compatible with additional metastases. 4. 12 mm pleural-based nodule in the left upper lid may represent a primary bronchogenic neoplasm. This is more likely metastatic disease. 5. Findings are most concerning for a primary lung cancer. Recommend CT of the chest, abdomen, and pelvis with contrast for further evaluation of the primary neoplasm and extent of metastatic disease.  3. PET CT scan on 04/10/2016 showed:IMPRESSION:1. Extensive lytic malignancy in the skeleton with heavy burden of hepatic metastatic lesions, adenopathy in the chest, lower neck, and likely porta hepatis compatible with malignancy ; and a small left upper lobe  pulmonary nodule which is likewise hypermetabolic. The bony disease resembles myeloma but it would be unusual to have this much hepatic and soft tissue involvement in the setting of myeloma. Other possibilities might include an unusually aggressive central lung cancer. Tissue diagnosis recommended. 2. AP window mass is indistinct and may be interfering with the recurrent laryngeal nerve. The asymmetric appearance of the cords is probably secondary to this interference. 3. 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 Cholelithiasis.  4. US guided liver biopsy showed: DIAGNOSIS:  A. LIVER MASS, LEFT LOBE; ULTRASOUND GUIDED BIOPSY:  - METASTATIC NON-SMALL CELL CARCINOMA, TTF-1 POSITIVE.   Comment:  A limited panel of immunohistochemical stains was performed. The  metastatic malignant neoplasm demonstrates the following pattern of  immunoreactivity:  Super pancytokeratin: Positive  TTF-1: Positive  CD56: Negative  CDX-2: Negative  Stain controls worked appropriately. The morphologic findings are  consistent with metastatic poorly differentiated carcinoma. The presence  of TTF-1 positivity is compatible with lung origin.   5. Cycle 1 carbo/alimta given on 04/26/16. He gets monthly X geva. CEA elevated at 7641 at baseline  6. Foundation one testing showed no EGFR, ALK, ROS and BRAF mutations. PDL1 expression was 70%  Interval history- feels tired. Still has a hoarse voice. Continues to have right sided rib and RUQ abdominal pain  ECOG PS- 2 Pain scale- 3 Opioid associated constipation- yes  Review of systems- Review of Systems  Constitutional: Positive for malaise/fatigue. Negative for chills, fever and weight loss.  HENT: Negative for congestion, ear discharge and nosebleeds.  Hoarseness of voice +  Eyes: Negative for blurred vision.  Respiratory:  Positive for shortness of breath. Negative for cough, hemoptysis, sputum production and wheezing.   Cardiovascular: Negative for chest pain, palpitations, orthopnea and claudication.       Right sided rib pain  Gastrointestinal: Positive for abdominal pain. Negative for blood in stool, constipation, diarrhea, heartburn, melena, nausea and vomiting.  Genitourinary: Negative for dysuria, flank pain, frequency, hematuria and urgency.  Musculoskeletal: Negative for back pain, joint pain and myalgias.  Skin: Negative for rash.  Neurological: Negative for dizziness, tingling, focal weakness, seizures, weakness and headaches.  Endo/Heme/Allergies: Does not bruise/bleed easily.  Psychiatric/Behavioral: Negative for depression and suicidal ideas. The patient does not have insomnia.        No Known Allergies   Past Medical History:  Diagnosis Date  . Allergy   . Diabetes mellitus without complication ([place])   . Dyspnea      Past Surgical History:  Procedure Laterality Date  . ADENOIDECTOMY    . COLONOSCOPY    . IR FLUORO GUIDE PORT INSERTION RIGHT  04/16/2016  . TONSILLECTOMY      Social History   Social History  . Marital status: Divorced    Spouse name: N/A  . Number of children: N/A  . Years of education: N/A   Occupational History  . Not on file.   Social History Main Topics  . Smoking status: Current Every Day Smoker    Packs/day: 1.00    Years: 40.00    Types: Cigarettes  . Smokeless tobacco: Never Used  . Alcohol use No  . Drug use: No  . Sexual activity: Not on file   Other Topics Concern  . Not on file   Social History Narrative  . No narrative on file    Family History  Problem Relation Age of Onset  . Cancer Mother   . Cancer Father      Current Outpatient Prescriptions:  .  chlorproMAZINE (THORAZINE) 25 MG tablet, Take 1 tablet (25 mg total) by mouth every 6 (six) hours as needed., Disp: 30 tablet, Rfl: 0 .  dexamethasone (DECADRON) 4 MG tablet,  Take 1 tab two times a day the day before Alimta chemo. Take 2 tabs two times a day starting the day after chemo for 3 days., Disp: 30 tablet, Rfl: 1 .  fexofenadine (ALLEGRA) 180 MG tablet, Take 1 tablet (180 mg total) by mouth daily., Disp: 90 tablet, Rfl: 2 .  fluticasone (FLONASE) 50 MCG/ACT nasal spray, Place 2 sprays into both nostrils daily., Disp: , Rfl:  .  folic acid (FOLVITE) 1 MG tablet, Take 1 tablet (1 mg total) by mouth daily. Start 5-7 days before Alimta chemotherapy. Continue until 21 days after Alimta completed., Disp: 100 tablet, Rfl: 3 .  lidocaine (LIDODERM) 5 %, Place 1 patch onto the skin daily. Remove & Discard patch within 12 hours or as directed by MD, Disp: 30 patch, Rfl: 0 .  lidocaine-prilocaine (EMLA) cream, Apply to affected area once, Disp: 30 g, Rfl: 3 .  metFORMIN (GLUCOPHAGE) 500 MG tablet, Take 1 tablet (500 mg total) by mouth 2 (two) times daily with a meal., Disp: 180 tablet, Rfl: 2 .  Morphine Sulfate (MORPHINE CONCENTRATE) 10 mg / 0.5 ml concentrated solution, Take by mouth every 4 (four) hours as needed for severe pain. 0.25 ml every 4 hours as needed (prescribed by Dr. Lew Dawes), Disp: , Rfl:  .  ondansetron (ZOFRAN) 8 MG tablet, Take 1 tablet (  8 mg total) by mouth 2 (two) times daily as needed for refractory nausea / vomiting. Start on day 3 after chemo., Disp: 30 tablet, Rfl: 1 .  prochlorperazine (COMPAZINE) 10 MG tablet, Take 1 tablet (10 mg total) by mouth every 6 (six) hours as needed (Nausea or vomiting)., Disp: 30 tablet, Rfl: 1 .  ranitidine (ZANTAC) 150 MG tablet, Take 1 tablet (150 mg total) by mouth 2 (two) times daily., Disp: 180 tablet, Rfl: 2 .  morphine (MS CONTIN) 15 MG 12 hr tablet, Take 1 tablet (15 mg total) by mouth every 12 (twelve) hours., Disp: 60 tablet, Rfl: 0 .  senna (SENOKOT) 8.6 MG TABS tablet, Take 2 tablets (17.2 mg total) by mouth at bedtime., Disp: 120 each, Rfl: 0  Physical exam:  Vitals:   05/17/16 1035  BP: 97/74    Pulse: 93  Resp: 18  Temp: (!) 96.5 F (35.8 C)  TempSrc: Tympanic  Weight: 162 lb 3.2 oz (73.6 kg)   Physical Exam  Constitutional: He is oriented to person, place, and time.  Thin, fatigued appears in no acute distress  HENT:  Head: Normocephalic and atraumatic.  Eyes: EOM are normal. Pupils are equal, round, and reactive to light.  Neck: Normal range of motion.  Cardiovascular: Normal rate, regular rhythm and normal heart sounds.   Pulmonary/Chest: Effort normal and breath sounds normal.  Abdominal: Soft. Bowel sounds are normal.  Neurological: He is alert and oriented to person, place, and time.  Skin: Skin is warm and dry.     CMP Latest Ref Rng & Units 05/17/2016  Glucose 65 - 99 mg/dL 100(H)  BUN 6 - 20 mg/dL 11  Creatinine 0.61 - 1.24 mg/dL 0.76  Sodium 135 - 145 mmol/L 136  Potassium 3.5 - 5.1 mmol/L 4.3  Chloride 101 - 111 mmol/L 99(L)  CO2 22 - 32 mmol/L 30  Calcium 8.9 - 10.3 mg/dL 9.1  Total Protein 6.5 - 8.1 g/dL 7.1  Total Bilirubin 0.3 - 1.2 mg/dL 0.6  Alkaline Phos 38 - 126 U/L 113  AST 15 - 41 U/L 33  ALT 17 - 63 U/L 34   CBC Latest Ref Rng & Units 05/17/2016  WBC 3.8 - 10.6 K/uL 5.8  Hemoglobin 13.0 - 18.0 g/dL 14.4  Hematocrit 40.0 - 52.0 % 42.2  Platelets 150 - 440 K/uL 287    No images are attached to the encounter.  Mr Jeri Cos Wo Contrast  Result Date: 04/23/2016 CLINICAL DATA:  Lung cancer with metastatic disease. EXAM: MRI HEAD WITHOUT AND WITH CONTRAST TECHNIQUE: Multiplanar, multiecho pulse sequences of the brain and surrounding structures were obtained without and with intravenous contrast. CONTRAST:  100m MULTIHANCE GADOBENATE DIMEGLUMINE 529 MG/ML IV SOLN COMPARISON:  PET scan 04/10/2016 FINDINGS: Brain: No acute infarct, hemorrhage, or mass lesion is present. The ventricles are of normal size. Mild atrophy is within normal limits for age. No significant white matter disease is present. No significant extraaxial fluid collection is present.  The internal auditory canals are within normal limits bilaterally. The brainstem and cerebellum are normal. Vascular: Normal flow voids. Skull and upper cervical spine: Multiple enhancing calvarial lesions are present. Ileus 3 separate lesions are present within the midline occipital bone. The largest 2 lesions measure 10 and 7 mm respectively. At least 2 lesions are present in the right parietal bone measuring up to 13 mm. A left frontal lesion on image 47 of series 14 measures 10 mm. Other smaller calvarial lesions are present as well. Heterogeneous  signal in the upper cervical spine is worrisome for metastatic disease. The clivus is intact. Sinuses/Orbits: The right maxillary sinus is shrunken, compatible with chronic disease. No active disease is present. The paranasal sinuses are otherwise clear. The mastoid air cells are clear bilaterally. The globes and orbits are within normal limits bilaterally. IMPRESSION: 1. Diffuse osseous metastases involving the skull and upper cervical spine as described. 2. No evidence for metastatic disease to the brain. 3. 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 Electronically Signed   By: San Morelle M.D.   On: 04/23/2016 09:05     Assessment and plan- Patient is a 66 y.o. male with stage IV TTF1 positive lung cancer with extensive mets to LN, bone and liver  I explained the results of foundation one testing to the patient. Patient has no actionable mutations. His PDL1 expression is 70%. He will definitely benefit with immunotherapy. Given recent data on benefit of chemoimmunotherapy, extensive disease burden and the fact that he did not have significant side effects from chemotherapy and I will proceed with carboplatin and Alimta and Keytruda with cycle #2. I discussed the risks and benefits of immunotherapy including all but not limited to autoimmune colitis pneumonitis and the need to monitor  his kidney and liver functions. Patient understands and agrees to proceed. If he is unable to tolerate the combination then I will plan to hold his chemotherapy and continue immunotherapy at that point. Otherwise I will plan to give him 3 more cycles of chemoimmunotherapy followed by immunotherapy alone until progression. I will plan to get repeat scans after 4 cycles of chemoimmunotherapy. hewill get cycle 2 tomorrow and I will see him back in 3 weeks for cycle 3 of treatment with labs.   Neoplasm related pain- will add morphine SR 15 mg BID to morphine slution prn.  Opioid induced constipation- add senna to docusate   Will give him 1L NS tomorrow if Systolic BP remains in 54'S     Visit Diagnosis 1. Primary malignant neoplasm of lung metastatic to other site, unspecified laterality ([place])   2. Encounter for antineoplastic chemotherapy   3. Encounter for antineoplastic immunotherapy   4. Neoplasm related pain   5. Therapeutic opioid induced constipation      Dr. Randa Evens, MD, MPH [facility] Pager- [phone] 05/17/2016 2:25 PM

## 2016-05-17 NOTE — Progress Notes (Signed)
Patient was having problems with constipation so started taking stool softeners which did help and he is no longer constipated.  Pain, more of discomfort, is mainly in ribs and back.  He did hold the Morphine, prescribed by Dr. Lew Dawes, for a week while constipated.  Plan on starting back tomorrow due to receiving chemo tomorrow and is going to take a stool softener while taking the Morphine.  He was feeling very tired yesterday and slept most of the day.  His bp is low today at 97/74 which is abnormal for him.  He will need a refill on the Dexamethasone.

## 2016-05-18 ENCOUNTER — Inpatient Hospital Stay: Payer: Medicare HMO

## 2016-05-18 ENCOUNTER — Other Ambulatory Visit: Payer: Self-pay | Admitting: Hematology and Oncology

## 2016-05-18 VITALS — BP 110/71 | HR 76 | Resp 18

## 2016-05-18 DIAGNOSIS — C3490 Malignant neoplasm of unspecified part of unspecified bronchus or lung: Secondary | ICD-10-CM

## 2016-05-18 DIAGNOSIS — Z5111 Encounter for antineoplastic chemotherapy: Secondary | ICD-10-CM | POA: Diagnosis not present

## 2016-05-18 LAB — CEA: CEA: 5143 ng/mL — ABNORMAL HIGH (ref 0.0–4.7)

## 2016-05-18 MED ORDER — HEPARIN SOD (PORK) LOCK FLUSH 100 UNIT/ML IV SOLN
500.0000 [IU] | Freq: Once | INTRAVENOUS | Status: AC
  Administered 2016-05-18: 500 [IU] via INTRAVENOUS

## 2016-05-18 MED ORDER — SODIUM CHLORIDE 0.9 % IV SOLN
520.0000 mg | Freq: Once | INTRAVENOUS | Status: AC
  Administered 2016-05-18: 520 mg via INTRAVENOUS
  Filled 2016-05-18: qty 52

## 2016-05-18 MED ORDER — SODIUM CHLORIDE 0.9 % IV SOLN
Freq: Once | INTRAVENOUS | Status: AC
  Administered 2016-05-18: 10:00:00 via INTRAVENOUS
  Filled 2016-05-18: qty 1000

## 2016-05-18 MED ORDER — SODIUM CHLORIDE 0.9% FLUSH
10.0000 mL | INTRAVENOUS | Status: DC | PRN
  Administered 2016-05-18: 10 mL via INTRAVENOUS
  Filled 2016-05-18: qty 10

## 2016-05-18 MED ORDER — HEPARIN SOD (PORK) LOCK FLUSH 100 UNIT/ML IV SOLN
INTRAVENOUS | Status: AC
  Filled 2016-05-18: qty 5

## 2016-05-18 MED ORDER — DEXAMETHASONE SODIUM PHOSPHATE 10 MG/ML IJ SOLN
10.0000 mg | Freq: Once | INTRAMUSCULAR | Status: AC
  Administered 2016-05-18: 10 mg via INTRAVENOUS
  Filled 2016-05-18: qty 1

## 2016-05-18 MED ORDER — SODIUM CHLORIDE 0.9 % IV SOLN
200.0000 mg | Freq: Once | INTRAVENOUS | Status: AC
  Administered 2016-05-18: 200 mg via INTRAVENOUS
  Filled 2016-05-18: qty 8

## 2016-05-18 MED ORDER — PALONOSETRON HCL INJECTION 0.25 MG/5ML
0.2500 mg | Freq: Once | INTRAVENOUS | Status: AC
  Administered 2016-05-18: 0.25 mg via INTRAVENOUS
  Filled 2016-05-18: qty 5

## 2016-05-18 MED ORDER — SODIUM CHLORIDE 0.9 % IV SOLN
500.0000 mg/m2 | Freq: Once | INTRAVENOUS | Status: AC
  Administered 2016-05-18: 1000 mg via INTRAVENOUS
  Filled 2016-05-18: qty 40

## 2016-05-24 ENCOUNTER — Other Ambulatory Visit: Payer: Self-pay | Admitting: Oncology

## 2016-05-24 ENCOUNTER — Other Ambulatory Visit: Payer: Self-pay | Admitting: *Deleted

## 2016-05-24 DIAGNOSIS — C7951 Secondary malignant neoplasm of bone: Secondary | ICD-10-CM | POA: Insufficient documentation

## 2016-05-24 DIAGNOSIS — C3490 Malignant neoplasm of unspecified part of unspecified bronchus or lung: Secondary | ICD-10-CM

## 2016-05-25 ENCOUNTER — Other Ambulatory Visit: Payer: Self-pay | Admitting: *Deleted

## 2016-05-25 ENCOUNTER — Inpatient Hospital Stay: Payer: Medicare HMO

## 2016-05-25 DIAGNOSIS — C3490 Malignant neoplasm of unspecified part of unspecified bronchus or lung: Secondary | ICD-10-CM

## 2016-05-25 DIAGNOSIS — Z5111 Encounter for antineoplastic chemotherapy: Secondary | ICD-10-CM | POA: Diagnosis not present

## 2016-05-25 DIAGNOSIS — C7951 Secondary malignant neoplasm of bone: Secondary | ICD-10-CM

## 2016-05-25 MED ORDER — DENOSUMAB 120 MG/1.7ML ~~LOC~~ SOLN
120.0000 mg | Freq: Once | SUBCUTANEOUS | Status: AC
  Administered 2016-05-25: 120 mg via SUBCUTANEOUS
  Filled 2016-05-25: qty 1.7

## 2016-05-25 NOTE — Progress Notes (Unsigned)
Confirmed with pharmacy that we can use the labs drawn 05/17/16.

## 2016-06-08 ENCOUNTER — Inpatient Hospital Stay: Payer: Medicare HMO

## 2016-06-08 ENCOUNTER — Inpatient Hospital Stay (HOSPITAL_BASED_OUTPATIENT_CLINIC_OR_DEPARTMENT_OTHER): Payer: Medicare HMO | Admitting: Oncology

## 2016-06-08 VITALS — BP 106/66 | HR 69 | Temp 96.3°F | Ht 75.0 in | Wt 162.2 lb

## 2016-06-08 DIAGNOSIS — K5900 Constipation, unspecified: Secondary | ICD-10-CM | POA: Diagnosis not present

## 2016-06-08 DIAGNOSIS — T402X5A Adverse effect of other opioids, initial encounter: Secondary | ICD-10-CM

## 2016-06-08 DIAGNOSIS — Z7984 Long term (current) use of oral hypoglycemic drugs: Secondary | ICD-10-CM

## 2016-06-08 DIAGNOSIS — E119 Type 2 diabetes mellitus without complications: Secondary | ICD-10-CM

## 2016-06-08 DIAGNOSIS — F17210 Nicotine dependence, cigarettes, uncomplicated: Secondary | ICD-10-CM | POA: Diagnosis not present

## 2016-06-08 DIAGNOSIS — J3801 Paralysis of vocal cords and larynx, unilateral: Secondary | ICD-10-CM

## 2016-06-08 DIAGNOSIS — C7951 Secondary malignant neoplasm of bone: Secondary | ICD-10-CM

## 2016-06-08 DIAGNOSIS — C3490 Malignant neoplasm of unspecified part of unspecified bronchus or lung: Secondary | ICD-10-CM | POA: Diagnosis not present

## 2016-06-08 DIAGNOSIS — R590 Localized enlarged lymph nodes: Secondary | ICD-10-CM | POA: Diagnosis not present

## 2016-06-08 DIAGNOSIS — M899 Disorder of bone, unspecified: Secondary | ICD-10-CM

## 2016-06-08 DIAGNOSIS — Z5111 Encounter for antineoplastic chemotherapy: Secondary | ICD-10-CM

## 2016-06-08 DIAGNOSIS — C787 Secondary malignant neoplasm of liver and intrahepatic bile duct: Secondary | ICD-10-CM

## 2016-06-08 DIAGNOSIS — I2510 Atherosclerotic heart disease of native coronary artery without angina pectoris: Secondary | ICD-10-CM

## 2016-06-08 DIAGNOSIS — K8020 Calculus of gallbladder without cholecystitis without obstruction: Secondary | ICD-10-CM | POA: Diagnosis not present

## 2016-06-08 DIAGNOSIS — I700 Atherosclerosis of aorta: Secondary | ICD-10-CM

## 2016-06-08 DIAGNOSIS — G893 Neoplasm related pain (acute) (chronic): Secondary | ICD-10-CM | POA: Diagnosis not present

## 2016-06-08 DIAGNOSIS — Z79899 Other long term (current) drug therapy: Secondary | ICD-10-CM

## 2016-06-08 DIAGNOSIS — C779 Secondary and unspecified malignant neoplasm of lymph node, unspecified: Secondary | ICD-10-CM | POA: Diagnosis not present

## 2016-06-08 LAB — COMPREHENSIVE METABOLIC PANEL
ALT: 35 U/L (ref 17–63)
AST: 38 U/L (ref 15–41)
Albumin: 3.9 g/dL (ref 3.5–5.0)
Alkaline Phosphatase: 101 U/L (ref 38–126)
Anion gap: 7 (ref 5–15)
BUN: 16 mg/dL (ref 6–20)
CHLORIDE: 103 mmol/L (ref 101–111)
CO2: 25 mmol/L (ref 22–32)
CREATININE: 0.72 mg/dL (ref 0.61–1.24)
Calcium: 9 mg/dL (ref 8.9–10.3)
Glucose, Bld: 174 mg/dL — ABNORMAL HIGH (ref 65–99)
POTASSIUM: 4.1 mmol/L (ref 3.5–5.1)
Sodium: 135 mmol/L (ref 135–145)
Total Bilirubin: 0.5 mg/dL (ref 0.3–1.2)
Total Protein: 7.2 g/dL (ref 6.5–8.1)

## 2016-06-08 LAB — CBC WITH DIFFERENTIAL/PLATELET
Basophils Absolute: 0 10*3/uL (ref 0–0.1)
Basophils Relative: 1 %
EOS PCT: 1 %
Eosinophils Absolute: 0 10*3/uL (ref 0–0.7)
HCT: 35.4 % — ABNORMAL LOW (ref 40.0–52.0)
Hemoglobin: 12.3 g/dL — ABNORMAL LOW (ref 13.0–18.0)
LYMPHS ABS: 1.5 10*3/uL (ref 1.0–3.6)
Lymphocytes Relative: 22 %
MCH: 32.9 pg (ref 26.0–34.0)
MCHC: 34.8 g/dL (ref 32.0–36.0)
MCV: 94.3 fL (ref 80.0–100.0)
MONO ABS: 0.9 10*3/uL (ref 0.2–1.0)
Monocytes Relative: 13 %
Neutro Abs: 4.4 10*3/uL (ref 1.4–6.5)
Neutrophils Relative %: 63 %
PLATELETS: 228 10*3/uL (ref 150–440)
RBC: 3.75 MIL/uL — ABNORMAL LOW (ref 4.40–5.90)
RDW: 17.1 % — AB (ref 11.5–14.5)
WBC: 6.9 10*3/uL (ref 3.8–10.6)

## 2016-06-08 LAB — TSH: TSH: 0.921 u[IU]/mL (ref 0.350–4.500)

## 2016-06-08 MED ORDER — PEMBROLIZUMAB CHEMO INJECTION 100 MG/4ML
200.0000 mg | Freq: Once | INTRAVENOUS | Status: AC
  Administered 2016-06-08: 200 mg via INTRAVENOUS
  Filled 2016-06-08: qty 8

## 2016-06-08 MED ORDER — SODIUM CHLORIDE 0.9 % IV SOLN
500.0000 mg/m2 | Freq: Once | INTRAVENOUS | Status: AC
  Administered 2016-06-08: 1000 mg via INTRAVENOUS
  Filled 2016-06-08: qty 40

## 2016-06-08 MED ORDER — CARBOPLATIN CHEMO INJECTION 600 MG/60ML
520.0000 mg | Freq: Once | INTRAVENOUS | Status: AC
  Administered 2016-06-08: 520 mg via INTRAVENOUS
  Filled 2016-06-08: qty 52

## 2016-06-08 MED ORDER — SODIUM CHLORIDE 0.9 % IV SOLN
Freq: Once | INTRAVENOUS | Status: AC
  Administered 2016-06-08: 10:00:00 via INTRAVENOUS
  Filled 2016-06-08: qty 1000

## 2016-06-08 MED ORDER — HEPARIN SOD (PORK) LOCK FLUSH 100 UNIT/ML IV SOLN
500.0000 [IU] | Freq: Once | INTRAVENOUS | Status: AC
  Administered 2016-06-08: 500 [IU] via INTRAVENOUS
  Filled 2016-06-08: qty 5

## 2016-06-08 MED ORDER — SODIUM CHLORIDE 0.9% FLUSH
10.0000 mL | INTRAVENOUS | Status: DC | PRN
  Administered 2016-06-08: 10 mL via INTRAVENOUS
  Filled 2016-06-08: qty 10

## 2016-06-08 MED ORDER — DEXAMETHASONE SODIUM PHOSPHATE 10 MG/ML IJ SOLN
10.0000 mg | Freq: Once | INTRAMUSCULAR | Status: AC
  Administered 2016-06-08: 10 mg via INTRAVENOUS
  Filled 2016-06-08: qty 1

## 2016-06-08 MED ORDER — PALONOSETRON HCL INJECTION 0.25 MG/5ML
0.2500 mg | Freq: Once | INTRAVENOUS | Status: AC
  Administered 2016-06-08: 0.25 mg via INTRAVENOUS
  Filled 2016-06-08: qty 5

## 2016-06-08 NOTE — Progress Notes (Signed)
Patient here for pre treatment check no changes since last appointment.

## 2016-06-09 LAB — T4: T4, Total: 9.8 ug/dL (ref 4.5–12.0)

## 2016-06-09 LAB — CEA: CEA: 3780 ng/mL — ABNORMAL HIGH (ref 0.0–4.7)

## 2016-06-12 ENCOUNTER — Encounter: Payer: Self-pay | Admitting: Oncology

## 2016-06-12 NOTE — Progress Notes (Signed)
Hematology/Oncology Consult note [facility]  Telephone:([phone] Fax:([phone]  Patient Care Team: Gennette Pac, [place] as PCP - General (Family Medicine)   Name of the patient: Austin Bryan  [id number]  [birth year]   Date of visit: 06/12/16  Chief complaint/ Reason for visit-  On treatment assessment prior to cycle # 3 of chemotherapy  Heme/Onc history:1. Patient is a 66 year old male who was seen by Dr. walk from ENT for symptoms of hoarseness of voice for 1 month. 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 Patient is a chronic smoker and has smoked about 1-1/2 packs per day for more than 30 years. He has lost about 17 pounds of weight over the last 6-7 months. Patient lives with his sister and is independent of hisADLs and IADLs.  2. CT soft tissue of the neck on 04/02/2016 showed: 1. Soft tissue mass along the undersurface of the aortic arch is most concerning for a metastatic adenopathy. This likely impacts the left recurrent laryngeal nerve leading to vocal cord paralysis. 2. Extensive mediastinal adenopathy compatible with metastatic disease. 3. Numerous lytic lesions throughout the visualized is cervical and thoracic spine as well is bilateral ribs. These are compatible with additional metastases. 4. 12 mm pleural-based nodule in the left upper lid may represent a primary bronchogenic neoplasm. This is more likely metastatic disease. 5. Findings are most concerning for a primary lung cancer. Recommend CT of the chest, abdomen, and pelvis with contrast for further evaluation of the primary neoplasm and extent of metastatic disease.  3. PET CT scan on 04/10/2016 showed:IMPRESSION:1. Extensive lytic malignancy in the skeleton with heavy burden of hepatic metastatic lesions, adenopathy in the chest, lower neck, and likely porta hepatis compatible with malignancy ; and a small left upper lobe pulmonary nodule which is likewise  hypermetabolic. The bony disease resembles myeloma but it would be unusual to have this much hepatic and soft tissue involvement in the setting of myeloma. Other possibilities might include an unusually aggressive central lung cancer. Tissue diagnosis recommended. 2. AP window mass is indistinct and may be interfering with the recurrent laryngeal nerve. The asymmetric appearance of the cords is probably secondary to this interference. 3. 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 Cholelithiasis.  4. US guided liver biopsy showed: DIAGNOSIS:  A. LIVER MASS, LEFT LOBE; ULTRASOUND GUIDED BIOPSY:  - METASTATIC NON-SMALL CELL CARCINOMA, TTF-1 POSITIVE.   Comment:  A limited panel of immunohistochemical stains was performed. The  metastatic malignant neoplasm demonstrates the following pattern of  immunoreactivity:  Super pancytokeratin: Positive  TTF-1: Positive  CD56: Negative  CDX-2: Negative  Stain controls worked appropriately. The morphologic findings are  consistent with metastatic poorly differentiated carcinoma. The presence  of TTF-1 positivity is compatible with lung origin.   5. Cycle 1 carbo/alimta given on 04/26/16. He gets monthly X geva. CEA elevated at 7641 at baseline. keytruda added to cycle 2  6. Foundation one testing showed no EGFR, ALK, ROS and BRAF mutations. PDL1 expression was 70%   Interval history- reports fatigue. Pain well controlled with pain meds. He has not been using prn meds often. Still has hoarseness of voice. Otherwise tolerating chemo well without significant side effects  ECOG PS- 2 Pain scale- 0 Opioid associated constipation- 0  Review of systems- Review of Systems  Constitutional: Positive for malaise/fatigue. Negative for chills, fever and weight loss.  HENT: Negative for  congestion, ear discharge and  nosebleeds.        Hoarseness of voice  Eyes: Negative for blurred vision.  Respiratory: Negative for cough, hemoptysis, sputum production, shortness of breath and wheezing.   Cardiovascular: Negative for chest pain, palpitations, orthopnea and claudication.  Gastrointestinal: Negative for abdominal pain, blood in stool, constipation, diarrhea, heartburn, melena, nausea and vomiting.  Genitourinary: Negative for dysuria, flank pain, frequency, hematuria and urgency.  Musculoskeletal: Negative for back pain, joint pain and myalgias.  Skin: Negative for rash.  Neurological: Positive for weakness. Negative for dizziness, tingling, focal weakness, seizures and headaches.  Endo/Heme/Allergies: Does not bruise/bleed easily.  Psychiatric/Behavioral: Negative for depression and suicidal ideas. The patient does not have insomnia.       No Known Allergies   Past Medical History:  Diagnosis Date  . Allergy   . Diabetes mellitus without complication ([place])   . Dyspnea      Past Surgical History:  Procedure Laterality Date  . ADENOIDECTOMY    . COLONOSCOPY    . IR FLUORO GUIDE PORT INSERTION RIGHT  04/16/2016  . TONSILLECTOMY      Social History   Social History  . Marital status: Divorced    Spouse name: N/A  . Number of children: N/A  . Years of education: N/A   Occupational History  . Not on file.   Social History Main Topics  . Smoking status: Current Every Day Smoker    Packs/day: 1.00    Years: 40.00    Types: Cigarettes  . Smokeless tobacco: Never Used  . Alcohol use No  . Drug use: No  . Sexual activity: Not on file   Other Topics Concern  . Not on file   Social History Narrative  . No narrative on file    Family History  Problem Relation Age of Onset  . Cancer Mother   . Cancer Father      Current Outpatient Prescriptions:  .  chlorproMAZINE (THORAZINE) 25 MG tablet, Take 1 tablet (25 mg total) by mouth every 6 (six) hours as needed., Disp: 30 tablet,  Rfl: 0 .  dexamethasone (DECADRON) 4 MG tablet, Take 1 tab two times a day the day before Alimta chemo. Take 2 tabs two times a day starting the day after chemo for 3 days., Disp: 30 tablet, Rfl: 1 .  fexofenadine (ALLEGRA) 180 MG tablet, Take 1 tablet (180 mg total) by mouth daily., Disp: 90 tablet, Rfl: 2 .  fluticasone (FLONASE) 50 MCG/ACT nasal spray, Place 2 sprays into both nostrils daily., Disp: , Rfl:  .  folic acid (FOLVITE) 1 MG tablet, Take 1 tablet (1 mg total) by mouth daily. Start 5-7 days before Alimta chemotherapy. Continue until 21 days after Alimta completed., Disp: 100 tablet, Rfl: 3 .  lidocaine (LIDODERM) 5 %, Place 1 patch onto the skin daily. Remove & Discard patch within 12 hours or as directed by MD, Disp: 30 patch, Rfl: 0 .  lidocaine-prilocaine (EMLA) cream, Apply to affected area once, Disp: 30 g, Rfl: 3 .  metFORMIN (GLUCOPHAGE) 500 MG tablet, Take 1 tablet (500 mg total) by mouth 2 (two) times daily with a meal., Disp: 180 tablet, Rfl: 2 .  morphine (MS CONTIN) 15 MG 12 hr tablet, Take 1 tablet (15 mg total) by mouth every 12 (twelve) hours., Disp: 60 tablet, Rfl: 0 .  Morphine Sulfate (MORPHINE CONCENTRATE) 10 mg / 0.5 ml concentrated solution, Take by mouth every 4 (four) hours as needed for severe pain. 0.25  ml every 4 hours as needed (prescribed by Dr. Lew Dawes), Disp: , Rfl:  .  ondansetron (ZOFRAN) 8 MG tablet, Take 1 tablet (8 mg total) by mouth 2 (two) times daily as needed for refractory nausea / vomiting. Start on day 3 after chemo., Disp: 30 tablet, Rfl: 1 .  prochlorperazine (COMPAZINE) 10 MG tablet, Take 1 tablet (10 mg total) by mouth every 6 (six) hours as needed (Nausea or vomiting)., Disp: 30 tablet, Rfl: 1 .  ranitidine (ZANTAC) 150 MG tablet, Take 1 tablet (150 mg total) by mouth 2 (two) times daily., Disp: 180 tablet, Rfl: 2 .  senna (SENOKOT) 8.6 MG TABS tablet, Take 2 tablets (17.2 mg total) by mouth at bedtime., Disp: 120 each, Rfl: 0  Physical  exam:  Vitals:   06/08/16 0911  BP: 106/66  Pulse: 69  Temp: (!) 96.3 F (35.7 C)  TempSrc: Tympanic  Weight: 162 lb 3.2 oz (73.6 kg)  Height: 6' 3"  (1.905 m)   Physical Exam  Constitutional: He is oriented to person, place, and time.  He is thin and appears fatigued  HENT:  Head: Normocephalic and atraumatic.  Eyes: EOM are normal. Pupils are equal, round, and reactive to light.  Neck: Normal range of motion.  Cardiovascular: Normal rate, regular rhythm and normal heart sounds.   Pulmonary/Chest: Effort normal and breath sounds normal.  Abdominal: Soft. Bowel sounds are normal.  Neurological: He is alert and oriented to person, place, and time.  Skin: Skin is warm and dry.     CMP Latest Ref Rng & Units 06/08/2016  Glucose 65 - 99 mg/dL 174(H)  BUN 6 - 20 mg/dL 16  Creatinine 0.61 - 1.24 mg/dL 0.72  Sodium 135 - 145 mmol/L 135  Potassium 3.5 - 5.1 mmol/L 4.1  Chloride 101 - 111 mmol/L 103  CO2 22 - 32 mmol/L 25  Calcium 8.9 - 10.3 mg/dL 9.0  Total Protein 6.5 - 8.1 g/dL 7.2  Total Bilirubin 0.3 - 1.2 mg/dL 0.5  Alkaline Phos 38 - 126 U/L 101  AST 15 - 41 U/L 38  ALT 17 - 63 U/L 35   CBC Latest Ref Rng & Units 06/08/2016  WBC 3.8 - 10.6 K/uL 6.9  Hemoglobin 13.0 - 18.0 g/dL 12.3(L)  Hematocrit 40.0 - 52.0 % 35.4(L)  Platelets 150 - 440 K/uL 228     Assessment and plan- Patient is a 66 y.o. male  stage IV TTF1 positive lung cancer with extensive mets to LN, bone and liver here for on treatment assessment prior to cycle 3 of carbo/alimta and keytruda  Counts ok to proceed with chemo as planned. Remains quite fatigued. CEA is responding and trend is as follows: Component     Latest Ref Rng & Units 04/26/2016 05/17/2016 06/08/2016  CEA     0.0 - 4.7 ng/mL 7,641.0 (H) 5,143.0 (H) 3,780.0 (H)    I will see him back in 3 weeks prior to cycle 3 of chemotherapy.  Neoplasm related pain- continue ms contin and morphine prn  Visit Diagnosis 1. Primary malignant neoplasm of  lung metastatic to other site, unspecified laterality ([place])   2. Neoplasm related pain   3. Encounter for antineoplastic chemotherapy      Dr. Randa Evens, MD, MPH [facility] Pager- [phone] 06/12/2016 7:19 AM

## 2016-06-18 ENCOUNTER — Inpatient Hospital Stay: Payer: Medicare HMO | Attending: Oncology

## 2016-06-18 DIAGNOSIS — D6959 Other secondary thrombocytopenia: Secondary | ICD-10-CM | POA: Insufficient documentation

## 2016-06-18 DIAGNOSIS — R590 Localized enlarged lymph nodes: Secondary | ICD-10-CM | POA: Insufficient documentation

## 2016-06-18 DIAGNOSIS — E119 Type 2 diabetes mellitus without complications: Secondary | ICD-10-CM | POA: Insufficient documentation

## 2016-06-18 DIAGNOSIS — G893 Neoplasm related pain (acute) (chronic): Secondary | ICD-10-CM | POA: Insufficient documentation

## 2016-06-18 DIAGNOSIS — C779 Secondary and unspecified malignant neoplasm of lymph node, unspecified: Secondary | ICD-10-CM | POA: Insufficient documentation

## 2016-06-18 DIAGNOSIS — F17210 Nicotine dependence, cigarettes, uncomplicated: Secondary | ICD-10-CM | POA: Insufficient documentation

## 2016-06-18 DIAGNOSIS — Z5111 Encounter for antineoplastic chemotherapy: Secondary | ICD-10-CM | POA: Insufficient documentation

## 2016-06-18 DIAGNOSIS — C3412 Malignant neoplasm of upper lobe, left bronchus or lung: Secondary | ICD-10-CM | POA: Insufficient documentation

## 2016-06-18 DIAGNOSIS — Z7689 Persons encountering health services in other specified circumstances: Secondary | ICD-10-CM | POA: Insufficient documentation

## 2016-06-18 DIAGNOSIS — K8020 Calculus of gallbladder without cholecystitis without obstruction: Secondary | ICD-10-CM | POA: Insufficient documentation

## 2016-06-18 DIAGNOSIS — K5900 Constipation, unspecified: Secondary | ICD-10-CM | POA: Insufficient documentation

## 2016-06-18 DIAGNOSIS — Z5112 Encounter for antineoplastic immunotherapy: Secondary | ICD-10-CM | POA: Insufficient documentation

## 2016-06-18 DIAGNOSIS — C7951 Secondary malignant neoplasm of bone: Secondary | ICD-10-CM | POA: Insufficient documentation

## 2016-06-18 DIAGNOSIS — Z7984 Long term (current) use of oral hypoglycemic drugs: Secondary | ICD-10-CM | POA: Insufficient documentation

## 2016-06-18 DIAGNOSIS — C787 Secondary malignant neoplasm of liver and intrahepatic bile duct: Secondary | ICD-10-CM | POA: Insufficient documentation

## 2016-06-18 DIAGNOSIS — J3801 Paralysis of vocal cords and larynx, unilateral: Secondary | ICD-10-CM | POA: Insufficient documentation

## 2016-06-18 DIAGNOSIS — Z79899 Other long term (current) drug therapy: Secondary | ICD-10-CM | POA: Insufficient documentation

## 2016-06-18 NOTE — Progress Notes (Signed)
Nutrition Assessment   Reason for Assessment:   MD referral for patient with lung cancer on chemotherapy  ASSESSMENT:  66 year old male with lung cancer and extensive metastatic disease to LN, bone, liver.  Patient currently receiving chemotherapy (carbo/alimnta and [place]). Patient also with hoarsness and left vocal cord paralysis, smoker, DM.  Patient reports for the past 2 months decrease appetite (especially after starting chemotherapy).  Patient reports no nausea, some constipation but relieved with stool softner.  Reports typically eats peanut butter crackers for breakfast or cereal, or sandwich, then maybe has meat sandwich for lunch or pot pie or few crackers, then for dinner same thing.  Patient does not cook much and lives with sister.  Does not like many vegetables.  Patient reports he drink glucerna 1-2 per day but not every day or equate brand shake.  (~1230 calories, 48 gm of protein) Patient reports some trouble swallowing foods, some coughing per patient with foods.    Nutrition Focused Physical Exam: deferred  Medications: folvite, metformin, zofran, compazine, senna  Labs: glucose 174  Anthropometrics:   Height: 75 inches Weight: 162 lb 3.2 oz UBW: patient unsure.  Noted on 04/10/16 186 lb BMI: 20  13% weight loss 2 months, significant weight loss   Estimated Energy Needs  Kcals: 2220-2500 calories/d Protein: 89-111 g/d Fluid: 2.5 L/d  NUTRITION DIAGNOSIS: Inadequate oral intake related to cancer and related treatment as evidenced by 13% weight loss in 2 months, eating < than 50% estimated energy needs in > or equal to 5 days.   MALNUTRITION DIAGNOSIS: Patient meets criteria for severe malnutrition in context of acute illness likely progressing to chronic illness with cancer diagnosis as evidenced by 13% weight loss in 2 months, eating < than 50% estimated energy needs in > or equal to 5 days.   INTERVENTION:   Discussed calorie, protein and carbohydrate  difference in glucerna shakes and 350 calorie shakes.  Encouraged at least 3-4 glucerna shakes per day or 2-3, 350 calorie shake to increase calories and protein.  Patient concerned about blood glucose control. Encouraged patient to liberalize diet at this time to allow for more higher calorie foods.  Samples of supplements given.  Blood sugar medication may need to be adjusted to allow high carbohydrate foods to decrease weight loss. Patient may benefit from SLP consult. Discussed high calorie, high protein foods and fact sheet given.  Patient verbalized understanding.     MONITORING, EVALUATION, GOAL: Patient will consume adequate calories to minimize weight loss   NEXT VISIT: phone call in 1 month.  Treatment dates are not on days when RD is at center.    Epiphany Seltzer B. Zenia Resides, [place], [place] Registered Dietitian [phone] (pager)

## 2016-06-29 ENCOUNTER — Inpatient Hospital Stay (HOSPITAL_BASED_OUTPATIENT_CLINIC_OR_DEPARTMENT_OTHER): Payer: Medicare HMO | Admitting: Oncology

## 2016-06-29 ENCOUNTER — Inpatient Hospital Stay: Payer: Medicare HMO

## 2016-06-29 ENCOUNTER — Encounter: Payer: Self-pay | Admitting: Oncology

## 2016-06-29 ENCOUNTER — Other Ambulatory Visit: Payer: Self-pay | Admitting: *Deleted

## 2016-06-29 VITALS — BP 111/78 | HR 80 | Temp 97.9°F | Resp 18 | Wt 162.5 lb

## 2016-06-29 DIAGNOSIS — C3412 Malignant neoplasm of upper lobe, left bronchus or lung: Secondary | ICD-10-CM

## 2016-06-29 DIAGNOSIS — C779 Secondary and unspecified malignant neoplasm of lymph node, unspecified: Secondary | ICD-10-CM | POA: Diagnosis not present

## 2016-06-29 DIAGNOSIS — Z79899 Other long term (current) drug therapy: Secondary | ICD-10-CM

## 2016-06-29 DIAGNOSIS — Z5112 Encounter for antineoplastic immunotherapy: Secondary | ICD-10-CM | POA: Diagnosis not present

## 2016-06-29 DIAGNOSIS — C3490 Malignant neoplasm of unspecified part of unspecified bronchus or lung: Secondary | ICD-10-CM

## 2016-06-29 DIAGNOSIS — C787 Secondary malignant neoplasm of liver and intrahepatic bile duct: Secondary | ICD-10-CM

## 2016-06-29 DIAGNOSIS — Z5111 Encounter for antineoplastic chemotherapy: Secondary | ICD-10-CM

## 2016-06-29 DIAGNOSIS — D6959 Other secondary thrombocytopenia: Secondary | ICD-10-CM

## 2016-06-29 DIAGNOSIS — Z7689 Persons encountering health services in other specified circumstances: Secondary | ICD-10-CM | POA: Diagnosis not present

## 2016-06-29 DIAGNOSIS — C7951 Secondary malignant neoplasm of bone: Secondary | ICD-10-CM | POA: Diagnosis not present

## 2016-06-29 DIAGNOSIS — K8020 Calculus of gallbladder without cholecystitis without obstruction: Secondary | ICD-10-CM

## 2016-06-29 DIAGNOSIS — K5900 Constipation, unspecified: Secondary | ICD-10-CM

## 2016-06-29 DIAGNOSIS — J3801 Paralysis of vocal cords and larynx, unilateral: Secondary | ICD-10-CM

## 2016-06-29 DIAGNOSIS — R590 Localized enlarged lymph nodes: Secondary | ICD-10-CM | POA: Diagnosis not present

## 2016-06-29 DIAGNOSIS — Z7984 Long term (current) use of oral hypoglycemic drugs: Secondary | ICD-10-CM | POA: Diagnosis not present

## 2016-06-29 DIAGNOSIS — G893 Neoplasm related pain (acute) (chronic): Secondary | ICD-10-CM | POA: Diagnosis not present

## 2016-06-29 DIAGNOSIS — F17210 Nicotine dependence, cigarettes, uncomplicated: Secondary | ICD-10-CM

## 2016-06-29 DIAGNOSIS — E119 Type 2 diabetes mellitus without complications: Secondary | ICD-10-CM | POA: Diagnosis not present

## 2016-06-29 LAB — COMPREHENSIVE METABOLIC PANEL
ALT: 52 U/L (ref 17–63)
AST: 55 U/L — AB (ref 15–41)
Albumin: 3.6 g/dL (ref 3.5–5.0)
Alkaline Phosphatase: 109 U/L (ref 38–126)
Anion gap: 7 (ref 5–15)
BILIRUBIN TOTAL: 0.4 mg/dL (ref 0.3–1.2)
BUN: 13 mg/dL (ref 6–20)
CHLORIDE: 100 mmol/L — AB (ref 101–111)
CO2: 27 mmol/L (ref 22–32)
CREATININE: 0.68 mg/dL (ref 0.61–1.24)
Calcium: 8.5 mg/dL — ABNORMAL LOW (ref 8.9–10.3)
GFR calc Af Amer: >60 mL/min (ref >60)
Glucose, Bld: 127 mg/dL — ABNORMAL HIGH (ref 65–99)
POTASSIUM: 4 mmol/L (ref 3.5–5.1)
Sodium: 134 mmol/L — ABNORMAL LOW (ref 135–145)
Total Protein: 6.7 g/dL (ref 6.5–8.1)

## 2016-06-29 LAB — CBC WITH DIFFERENTIAL/PLATELET
BASOS ABS: 0 10*3/uL (ref 0–0.1)
BASOS PCT: 1 %
Eosinophils Absolute: 0.1 10*3/uL (ref 0–0.7)
Eosinophils Relative: 3 %
HCT: 34.6 % — ABNORMAL LOW (ref 40.0–52.0)
Hemoglobin: 11.9 g/dL — ABNORMAL LOW (ref 13.0–18.0)
LYMPHS PCT: 20 %
Lymphs Abs: 0.9 10*3/uL — ABNORMAL LOW (ref 1.0–3.6)
MCH: 32.8 pg (ref 26.0–34.0)
MCHC: 34.3 g/dL (ref 32.0–36.0)
MCV: 95.6 fL (ref 80.0–100.0)
Monocytes Absolute: 1.1 10*3/uL — ABNORMAL HIGH (ref 0.2–1.0)
Monocytes Relative: 24 %
NEUTROS ABS: 2.5 10*3/uL (ref 1.4–6.5)
NEUTROS PCT: 52 %
Platelets: 111 10*3/uL — ABNORMAL LOW (ref 150–440)
RBC: 3.62 MIL/uL — AB (ref 4.40–5.90)
RDW: 19.8 % — ABNORMAL HIGH (ref 11.5–14.5)
WBC: 4.7 10*3/uL (ref 3.8–10.6)

## 2016-06-29 LAB — MAGNESIUM: MAGNESIUM: 1.9 mg/dL (ref 1.7–2.4)

## 2016-06-29 MED ORDER — MORPHINE SULFATE ER 15 MG PO TBCR: 15.0000 mg | EXTENDED_RELEASE_TABLET | Freq: Two times a day (BID) | ORAL | 0 refills | Status: DC

## 2016-06-29 MED ORDER — SODIUM CHLORIDE 0.9 % IV SOLN
500.0000 mg/m2 | Freq: Once | INTRAVENOUS | Status: AC
  Administered 2016-06-29: 1000 mg via INTRAVENOUS
  Filled 2016-06-29: qty 40

## 2016-06-29 MED ORDER — CYANOCOBALAMIN 1000 MCG/ML IJ SOLN
1000.0000 ug | Freq: Once | INTRAMUSCULAR | Status: AC
  Administered 2016-06-29: 1000 ug via INTRAMUSCULAR
  Filled 2016-06-29: qty 1

## 2016-06-29 MED ORDER — SODIUM CHLORIDE 0.9 % IV SOLN
Freq: Once | INTRAVENOUS | Status: AC
  Administered 2016-06-29: 12:00:00 via INTRAVENOUS
  Filled 2016-06-29: qty 1000

## 2016-06-29 MED ORDER — CARBOPLATIN CHEMO INJECTION 600 MG/60ML
520.0000 mg | Freq: Once | INTRAVENOUS | Status: AC
  Administered 2016-06-29: 520 mg via INTRAVENOUS
  Filled 2016-06-29: qty 52

## 2016-06-29 MED ORDER — DEXAMETHASONE SODIUM PHOSPHATE 10 MG/ML IJ SOLN
10.0000 mg | Freq: Once | INTRAMUSCULAR | Status: AC
  Administered 2016-06-29: 10 mg via INTRAVENOUS
  Filled 2016-06-29: qty 1

## 2016-06-29 MED ORDER — DENOSUMAB 120 MG/1.7ML ~~LOC~~ SOLN
120.0000 mg | Freq: Once | SUBCUTANEOUS | Status: AC
  Administered 2016-06-29: 120 mg via SUBCUTANEOUS
  Filled 2016-06-29: qty 1.7

## 2016-06-29 MED ORDER — DEXAMETHASONE 4 MG PO TABS: ORAL_TABLET | ORAL | 1 refills | Status: DC

## 2016-06-29 MED ORDER — SODIUM CHLORIDE 0.9 % IV SOLN
200.0000 mg | Freq: Once | INTRAVENOUS | Status: AC
  Administered 2016-06-29: 200 mg via INTRAVENOUS
  Filled 2016-06-29: qty 8

## 2016-06-29 MED ORDER — PALONOSETRON HCL INJECTION 0.25 MG/5ML
0.2500 mg | Freq: Once | INTRAVENOUS | Status: AC
  Administered 2016-06-29: 0.25 mg via INTRAVENOUS
  Filled 2016-06-29: qty 5

## 2016-06-29 MED ORDER — HEPARIN SOD (PORK) LOCK FLUSH 100 UNIT/ML IV SOLN
500.0000 [IU] | Freq: Once | INTRAVENOUS | Status: AC | PRN
  Administered 2016-06-29: 500 [IU]
  Filled 2016-06-29: qty 5

## 2016-06-29 NOTE — Progress Notes (Signed)
Here for follow up

## 2016-06-29 NOTE — Progress Notes (Signed)
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 Patient is a 66 year old male who was seen by Dr. walk from ENT for symptoms of hoarseness of voice for 1 month. 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 Patient is a chronic smoker and has smoked about 1-1/2 packs per day for more than 30 years. He has lost about 17 pounds of weight over the last 6-7 months. Patient lives with his sister and is independent of hisADLs and IADLs.  2. CT soft tissue of the neck on 04/02/2016 showed: 1. Soft tissue mass along the undersurface of the aortic arch is most concerning for a metastatic adenopathy. This likely impacts the left recurrent laryngeal nerve leading to vocal cord paralysis. 2. Extensive mediastinal adenopathy compatible with metastatic disease. 3. Numerous lytic lesions throughout the visualized is cervical and thoracic spine as well is bilateral ribs. These are compatible with additional metastases. 4. 12 mm pleural-based nodule in the left upper lid may represent a primary bronchogenic neoplasm. This is more likely metastatic disease. 5. Findings are most concerning for a primary lung cancer. Recommend CT of the chest, abdomen, and pelvis with contrast for further evaluation of the primary neoplasm and extent of metastatic disease.  3. PET CT scan on 04/10/2016 showed:IMPRESSION:1. Extensive lytic malignancy in the skeleton with heavy burden of hepatic metastatic lesions, adenopathy in the chest, lower neck, and likely porta hepatis compatible with malignancy ; and a small left upper lobe  pulmonary nodule which is likewise hypermetabolic. The bony disease resembles myeloma but it would be unusual to have this much hepatic and soft tissue involvement in the setting of myeloma. Other possibilities might include an unusually aggressive central lung cancer. Tissue diagnosis recommended. 2. AP window mass is indistinct and may be interfering with the recurrent laryngeal nerve. The asymmetric appearance of the cords is probably secondary to this interference. 3. 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 Cholelithiasis.  4. US guided liver biopsy showed: DIAGNOSIS:  A. LIVER MASS, LEFT LOBE; ULTRASOUND GUIDED BIOPSY:  - METASTATIC NON-SMALL CELL CARCINOMA, TTF-1 POSITIVE.   Comment:  A limited panel of immunohistochemical stains was performed. The  metastatic malignant neoplasm demonstrates the following pattern of  immunoreactivity:  Super pancytokeratin: Positive  TTF-1: Positive  CD56: Negative  CDX-2: Negative  Stain controls worked appropriately. The morphologic findings are  consistent with metastatic poorly differentiated carcinoma. The presence  of TTF-1 positivity is compatible with lung origin.   5. Cycle 1 carbo/alimta given on 04/26/16. He gets monthly X geva. CEA elevated at 7641 at baseline. keytruda added to cycle 2  6. Foundation one testing showed no EGFR, ALK, ROS and BRAF mutations. PDL1 expression was 70%. No other actionable mutations  Interval history- pain is well controlled. Fatigue is mildly better. Hoarseness of voice persists  ECOG PS- 2 Pain scale- 1 Opioid associated constipation- no  Review of systems- Review of Systems  Constitutional: Positive for malaise/fatigue. Negative for chills, fever and weight loss.  HENT: Negative for congestion, ear discharge and nosebleeds.  Eyes: Negative for blurred vision.    Respiratory: Negative for cough, hemoptysis, sputum production, shortness of breath and wheezing.   Cardiovascular: Negative for chest pain, palpitations, orthopnea and claudication.  Gastrointestinal: Positive for abdominal pain. Negative for blood in stool, constipation, diarrhea, heartburn, melena, nausea and vomiting.  Genitourinary: Negative for dysuria, flank pain, frequency, hematuria and urgency.  Musculoskeletal: Negative for back pain, joint pain and myalgias.  Skin: Negative for rash.  Neurological: Negative for dizziness, tingling, focal weakness, seizures, weakness and headaches.  Endo/Heme/Allergies: Does not bruise/bleed easily.  Psychiatric/Behavioral: Negative for depression and suicidal ideas. The patient does not have insomnia.        No Known Allergies   Past Medical History:  Diagnosis Date  . Allergy   . Diabetes mellitus without complication ([place])   . Dyspnea      Past Surgical History:  Procedure Laterality Date  . ADENOIDECTOMY    . COLONOSCOPY    . IR FLUORO GUIDE PORT INSERTION RIGHT  04/16/2016  . TONSILLECTOMY      Social History   Social History  . Marital status: Divorced    Spouse name: N/A  . Number of children: N/A  . Years of education: N/A   Occupational History  . Not on file.   Social History Main Topics  . Smoking status: Current Every Day Smoker    Packs/day: 1.00    Years: 40.00    Types: Cigarettes  . Smokeless tobacco: Never Used  . Alcohol use No  . Drug use: No  . Sexual activity: Not on file   Other Topics Concern  . Not on file   Social History Narrative  . No narrative on file    Family History  Problem Relation Age of Onset  . Cancer Mother   . Cancer Father      Current Outpatient Prescriptions:  .  chlorproMAZINE (THORAZINE) 25 MG tablet, Take 1 tablet (25 mg total) by mouth every 6 (six) hours as needed., Disp: 30 tablet, Rfl: 0 .  dexamethasone (DECADRON) 4 MG tablet, Take 1 tab two times a day  the day before Alimta chemo. Take 2 tabs two times a day starting the day after chemo for 3 days., Disp: 30 tablet, Rfl: 1 .  fexofenadine (ALLEGRA) 180 MG tablet, Take 1 tablet (180 mg total) by mouth daily., Disp: 90 tablet, Rfl: 2 .  fluticasone (FLONASE) 50 MCG/ACT nasal spray, Place 2 sprays into both nostrils daily., Disp: , Rfl:  .  folic acid (FOLVITE) 1 MG tablet, Take 1 tablet (1 mg total) by mouth daily. Start 5-7 days before Alimta chemotherapy. Continue until 21 days after Alimta completed., Disp: 100 tablet, Rfl: 3 .  lidocaine (LIDODERM) 5 %, Place 1 patch onto the skin daily. Remove & Discard patch within 12 hours or as directed by MD, Disp: 30 patch, Rfl: 0 .  lidocaine-prilocaine (EMLA) cream, Apply to affected area once, Disp: 30 g, Rfl: 3 .  metFORMIN (GLUCOPHAGE) 500 MG tablet, Take 1 tablet (500 mg total) by mouth 2 (two) times daily with a meal., Disp: 180 tablet, Rfl: 2 .  morphine (MS CONTIN) 15 MG 12 hr tablet, Take 1 tablet (15 mg total) by mouth every 12 (twelve) hours., Disp: 60 tablet, Rfl: 0 .  Morphine Sulfate (MORPHINE CONCENTRATE) 10 mg / 0.5 ml concentrated solution, Take by mouth every 4 (four) hours as needed for severe pain. 0.25 ml every 4 hours as needed (prescribed by Dr. Lew Dawes), Disp: , Rfl:  .  ondansetron (ZOFRAN) 8 MG tablet, Take 1 tablet (8 mg total) by mouth 2 (two) times daily as needed for refractory nausea / vomiting. Start on day 3 after chemo., Disp: 30 tablet, Rfl: 1 .  prochlorperazine (COMPAZINE) 10 MG tablet, Take 1 tablet (10 mg total) by mouth every 6 (six) hours as needed (Nausea or vomiting)., Disp: 30 tablet, Rfl: 1 .  ranitidine (ZANTAC) 150 MG tablet, Take 1 tablet (150 mg total) by mouth 2 (two) times daily., Disp: 180 tablet, Rfl: 2 .  senna (SENOKOT) 8.6 MG TABS tablet, Take 2 tablets (17.2 mg total) by mouth at bedtime., Disp: 120 each, Rfl: 0  Physical exam:  Vitals:   06/29/16 1100  BP: 111/78  Pulse: 80  Resp: 18  Temp:  97.9 F (36.6 C)  TempSrc: Tympanic  Weight: 162 lb 8 oz (73.7 kg)   Physical Exam  Constitutional: He is oriented to person, place, and time.  Thin and fatigued. Appears in no acute distress  HENT:  Head: Normocephalic and atraumatic.  Eyes: EOM are normal. Pupils are equal, round, and reactive to light.  Neck: Normal range of motion.  Cardiovascular: Normal rate, regular rhythm and normal heart sounds.   Pulmonary/Chest: Effort normal and breath sounds normal.  Abdominal: Soft. Bowel sounds are normal.  Neurological: He is alert and oriented to person, place, and time.  Skin: Skin is warm and dry.     CMP Latest Ref Rng & Units 06/29/2016  Glucose 65 - 99 mg/dL 127(H)  BUN 6 - 20 mg/dL 13  Creatinine 0.61 - 1.24 mg/dL 0.68  Sodium 135 - 145 mmol/L 134(L)  Potassium 3.5 - 5.1 mmol/L 4.0  Chloride 101 - 111 mmol/L 100(L)  CO2 22 - 32 mmol/L 27  Calcium 8.9 - 10.3 mg/dL 8.5(L)  Total Protein 6.5 - 8.1 g/dL 6.7  Total Bilirubin 0.3 - 1.2 mg/dL 0.4  Alkaline Phos 38 - 126 U/L 109  AST 15 - 41 U/L 55(H)  ALT 17 - 63 U/L 52   CBC Latest Ref Rng & Units 06/29/2016  WBC 3.8 - 10.6 K/uL 4.7  Hemoglobin 13.0 - 18.0 g/dL 11.9(L)  Hematocrit 40.0 - 52.0 % 34.6(L)  Platelets 150 - 440 K/uL 111(L)     Assessment and plan- Patient is a 66 y.o. male  stage IV TTF1 positive lung cancer with extensive mets to LN, bone and liver here for on treatment assessment prior to cycle 4 of carbo/alimta and keytruda  Counts ok to proceed with cycle 4 of carbo/alimta/keytruda today. cea from today is pending but responding overall. Plan to repeat Ct chest abdomen and pelvis in 2 weeks time. I will see him back in 3 weeks and discuss results of scan. If he has responded to treatment, will consider maintenance alimta/ keytruda q3 weeks until progression. b12 and xgeva today. Mild thrombocytopenia from chemo. Continue to monitor  Neoplasm related pain- well controlled. Refill for morphine er given     Visit Diagnosis 1. Bone metastases ([place])   2. Primary malignant neoplasm of lung metastatic to other site, unspecified laterality ([place])   3. Encounter for antineoplastic chemotherapy      Dr. Randa Evens, MD, MPH [facility] Pager- [phone] 06/29/2016 10:23 AM

## 2016-06-30 LAB — CEA: CEA: 5499 ng/mL — ABNORMAL HIGH (ref 0.0–4.7)

## 2016-07-01 ENCOUNTER — Encounter: Payer: Self-pay | Admitting: Internal Medicine

## 2016-07-03 ENCOUNTER — Telehealth: Payer: Self-pay | Admitting: *Deleted

## 2016-07-03 NOTE — Telephone Encounter (Signed)
Called daughter and left message about bone scan.  I was suppose to order it the day he came for chemo and I did not due it til the end of the day. I forgot. I put the order in and it can not be done the same day of his ct scan.  It is ordered for 7/3 bone scan injectios 8:15 and then the scan 11:15 and there is no special instructions for it. I asked her to call me if that will not work for her.

## 2016-07-13 ENCOUNTER — Other Ambulatory Visit: Payer: Self-pay | Admitting: Oncology

## 2016-07-13 ENCOUNTER — Ambulatory Visit
Admission: RE | Admit: 2016-07-13 | Discharge: 2016-07-13 | Disposition: A | Discharge status: Home / Self Care | Payer: Medicare HMO | Source: Ambulatory Visit | Attending: Oncology | Admitting: Oncology

## 2016-07-13 DIAGNOSIS — C3490 Malignant neoplasm of unspecified part of unspecified bronchus or lung: Secondary | ICD-10-CM | POA: Diagnosis not present

## 2016-07-13 DIAGNOSIS — J439 Emphysema, unspecified: Secondary | ICD-10-CM | POA: Insufficient documentation

## 2016-07-13 DIAGNOSIS — I712 Thoracic aortic aneurysm, without rupture: Secondary | ICD-10-CM | POA: Insufficient documentation

## 2016-07-13 DIAGNOSIS — I700 Atherosclerosis of aorta: Secondary | ICD-10-CM | POA: Insufficient documentation

## 2016-07-13 DIAGNOSIS — C787 Secondary malignant neoplasm of liver and intrahepatic bile duct: Secondary | ICD-10-CM | POA: Insufficient documentation

## 2016-07-13 DIAGNOSIS — I2510 Atherosclerotic heart disease of native coronary artery without angina pectoris: Secondary | ICD-10-CM | POA: Insufficient documentation

## 2016-07-13 DIAGNOSIS — Z5111 Encounter for antineoplastic chemotherapy: Secondary | ICD-10-CM

## 2016-07-13 HISTORY — DX: Malignant neoplasm of unspecified part of unspecified bronchus or lung: C34.90

## 2016-07-13 MED ORDER — IOPAMIDOL (ISOVUE-300) INJECTION 61%
100.0000 mL | Freq: Once | INTRAVENOUS | Status: AC | PRN
  Administered 2016-07-13: 100 mL via INTRAVENOUS

## 2016-07-17 ENCOUNTER — Encounter
Admission: RE | Admit: 2016-07-17 | Discharge: 2016-07-17 | Disposition: A | Discharge status: Home / Self Care | Payer: Medicare HMO | Source: Ambulatory Visit | Attending: Oncology | Admitting: Oncology

## 2016-07-17 DIAGNOSIS — C3490 Malignant neoplasm of unspecified part of unspecified bronchus or lung: Secondary | ICD-10-CM

## 2016-07-17 MED ORDER — TECHNETIUM TC 99M MEDRONATE IV KIT
25.0000 | PACK | Freq: Once | INTRAVENOUS | Status: AC | PRN
  Administered 2016-07-17: 23.21 via INTRAVENOUS

## 2016-07-20 ENCOUNTER — Inpatient Hospital Stay: Payer: Medicare HMO | Attending: Oncology

## 2016-07-20 ENCOUNTER — Inpatient Hospital Stay: Payer: Medicare HMO

## 2016-07-20 ENCOUNTER — Inpatient Hospital Stay (HOSPITAL_BASED_OUTPATIENT_CLINIC_OR_DEPARTMENT_OTHER): Payer: Medicare HMO | Admitting: Oncology

## 2016-07-20 ENCOUNTER — Other Ambulatory Visit: Payer: Self-pay | Admitting: *Deleted

## 2016-07-20 ENCOUNTER — Encounter: Payer: Self-pay | Admitting: Oncology

## 2016-07-20 VITALS — BP 106/69 | HR 76 | Temp 98.2°F | Resp 18 | Wt 163.4 lb

## 2016-07-20 DIAGNOSIS — C3490 Malignant neoplasm of unspecified part of unspecified bronchus or lung: Secondary | ICD-10-CM | POA: Insufficient documentation

## 2016-07-20 DIAGNOSIS — G893 Neoplasm related pain (acute) (chronic): Secondary | ICD-10-CM

## 2016-07-20 DIAGNOSIS — R0602 Shortness of breath: Secondary | ICD-10-CM | POA: Diagnosis not present

## 2016-07-20 DIAGNOSIS — F17210 Nicotine dependence, cigarettes, uncomplicated: Secondary | ICD-10-CM | POA: Diagnosis not present

## 2016-07-20 DIAGNOSIS — I2510 Atherosclerotic heart disease of native coronary artery without angina pectoris: Secondary | ICD-10-CM | POA: Diagnosis not present

## 2016-07-20 DIAGNOSIS — J3801 Paralysis of vocal cords and larynx, unilateral: Secondary | ICD-10-CM

## 2016-07-20 DIAGNOSIS — C787 Secondary malignant neoplasm of liver and intrahepatic bile duct: Secondary | ICD-10-CM

## 2016-07-20 DIAGNOSIS — I712 Thoracic aortic aneurysm, without rupture: Secondary | ICD-10-CM | POA: Diagnosis not present

## 2016-07-20 DIAGNOSIS — Z9221 Personal history of antineoplastic chemotherapy: Secondary | ICD-10-CM | POA: Diagnosis not present

## 2016-07-20 DIAGNOSIS — Z5111 Encounter for antineoplastic chemotherapy: Secondary | ICD-10-CM | POA: Insufficient documentation

## 2016-07-20 DIAGNOSIS — I700 Atherosclerosis of aorta: Secondary | ICD-10-CM

## 2016-07-20 DIAGNOSIS — K8020 Calculus of gallbladder without cholecystitis without obstruction: Secondary | ICD-10-CM | POA: Diagnosis not present

## 2016-07-20 DIAGNOSIS — Z79899 Other long term (current) drug therapy: Secondary | ICD-10-CM | POA: Diagnosis not present

## 2016-07-20 DIAGNOSIS — C7951 Secondary malignant neoplasm of bone: Secondary | ICD-10-CM

## 2016-07-20 DIAGNOSIS — R590 Localized enlarged lymph nodes: Secondary | ICD-10-CM | POA: Insufficient documentation

## 2016-07-20 DIAGNOSIS — Z7984 Long term (current) use of oral hypoglycemic drugs: Secondary | ICD-10-CM | POA: Diagnosis not present

## 2016-07-20 DIAGNOSIS — R634 Abnormal weight loss: Secondary | ICD-10-CM | POA: Insufficient documentation

## 2016-07-20 DIAGNOSIS — Z5112 Encounter for antineoplastic immunotherapy: Secondary | ICD-10-CM | POA: Insufficient documentation

## 2016-07-20 DIAGNOSIS — J439 Emphysema, unspecified: Secondary | ICD-10-CM

## 2016-07-20 DIAGNOSIS — E119 Type 2 diabetes mellitus without complications: Secondary | ICD-10-CM

## 2016-07-20 DIAGNOSIS — Z85118 Personal history of other malignant neoplasm of bronchus and lung: Secondary | ICD-10-CM

## 2016-07-20 LAB — CBC WITH DIFFERENTIAL/PLATELET
BASOS ABS: 0 10*3/uL (ref 0–0.1)
Basophils Relative: 0 %
EOS PCT: 1 %
Eosinophils Absolute: 0.1 10*3/uL (ref 0–0.7)
HCT: 30.5 % — ABNORMAL LOW (ref 40.0–52.0)
Hemoglobin: 10.7 g/dL — ABNORMAL LOW (ref 13.0–18.0)
LYMPHS ABS: 1.7 10*3/uL (ref 1.0–3.6)
Lymphocytes Relative: 27 %
MCH: 33.8 pg (ref 26.0–34.0)
MCHC: 35 g/dL (ref 32.0–36.0)
MCV: 96.7 fL (ref 80.0–100.0)
MONO ABS: 1.5 10*3/uL — AB (ref 0.2–1.0)
Monocytes Relative: 25 %
Neutro Abs: 2.8 10*3/uL (ref 1.4–6.5)
Neutrophils Relative %: 47 %
PLATELETS: 177 10*3/uL (ref 150–440)
RBC: 3.15 MIL/uL — ABNORMAL LOW (ref 4.40–5.90)
RDW: 20.4 % — AB (ref 11.5–14.5)
WBC: 6.1 10*3/uL (ref 3.8–10.6)

## 2016-07-20 LAB — COMPREHENSIVE METABOLIC PANEL
ALT: 56 U/L (ref 17–63)
ANION GAP: 7 (ref 5–15)
AST: 54 U/L — ABNORMAL HIGH (ref 15–41)
Albumin: 3.4 g/dL — ABNORMAL LOW (ref 3.5–5.0)
Alkaline Phosphatase: 118 U/L (ref 38–126)
BUN: 9 mg/dL (ref 6–20)
CHLORIDE: 100 mmol/L — AB (ref 101–111)
CO2: 28 mmol/L (ref 22–32)
Calcium: 8.6 mg/dL — ABNORMAL LOW (ref 8.9–10.3)
Creatinine, Ser: 0.39 mg/dL — ABNORMAL LOW (ref 0.61–1.24)
Glucose, Bld: 83 mg/dL (ref 65–99)
POTASSIUM: 3.5 mmol/L (ref 3.5–5.1)
Sodium: 135 mmol/L (ref 135–145)
Total Bilirubin: 0.6 mg/dL (ref 0.3–1.2)
Total Protein: 6.5 g/dL (ref 6.5–8.1)

## 2016-07-20 MED ORDER — DEXAMETHASONE SODIUM PHOSPHATE 10 MG/ML IJ SOLN
10.0000 mg | Freq: Once | INTRAMUSCULAR | Status: AC
  Administered 2016-07-20: 10 mg via INTRAVENOUS

## 2016-07-20 MED ORDER — SODIUM CHLORIDE 0.9 % IV SOLN
200.0000 mg | Freq: Once | INTRAVENOUS | Status: AC
  Administered 2016-07-20: 200 mg via INTRAVENOUS
  Filled 2016-07-20: qty 8

## 2016-07-20 MED ORDER — PALONOSETRON HCL INJECTION 0.25 MG/5ML: 0.2500 mg | Freq: Once | INTRAVENOUS | Status: DC

## 2016-07-20 MED ORDER — SODIUM CHLORIDE 0.9 % IV SOLN
Freq: Once | INTRAVENOUS | Status: AC
  Administered 2016-07-20: 12:00:00 via INTRAVENOUS
  Filled 2016-07-20: qty 1000

## 2016-07-20 MED ORDER — HEPARIN SOD (PORK) LOCK FLUSH 100 UNIT/ML IV SOLN
500.0000 [IU] | Freq: Once | INTRAVENOUS | Status: AC | PRN
  Administered 2016-07-20: 500 [IU]

## 2016-07-20 MED ORDER — SODIUM CHLORIDE 0.9 % IV SOLN
500.0000 mg/m2 | Freq: Once | INTRAVENOUS | Status: AC
  Administered 2016-07-20: 1000 mg via INTRAVENOUS
  Filled 2016-07-20: qty 40

## 2016-07-20 MED ORDER — SODIUM CHLORIDE 0.9% FLUSH
10.0000 mL | INTRAVENOUS | Status: DC | PRN
  Filled 2016-07-20: qty 10

## 2016-07-20 NOTE — Progress Notes (Signed)
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 Patient is a 66 year old male who was seen by Dr. walk from ENT for symptoms of hoarseness of voice for 1 month. 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 Patient is a chronic smoker and has smoked about 1-1/2 packs per day for more than 30 years. He has lost about 17 pounds of weight over the last 6-7 months. Patient lives with his sister and is independent of hisADLs and IADLs.  2. CT soft tissue of the neck on 04/02/2016 showed: 1. Soft tissue mass along the undersurface of the aortic arch is most concerning for a metastatic adenopathy. This likely impacts the left recurrent laryngeal nerve leading to vocal cord paralysis. 2. Extensive mediastinal adenopathy compatible with metastatic disease. 3. Numerous lytic lesions throughout the visualized is cervical and thoracic spine as well is bilateral ribs. These are compatible with additional metastases. 4. 12 mm pleural-based nodule in the left upper lid may represent a primary bronchogenic neoplasm. This is more likely metastatic disease. 5. Findings are most concerning for a primary lung cancer. Recommend CT of the chest, abdomen, and pelvis with contrast for further evaluation of the primary neoplasm and extent of metastatic disease.  3. PET CT scan on 04/10/2016 showed:IMPRESSION:1. Extensive lytic malignancy in the skeleton with heavy burden of hepatic metastatic lesions, adenopathy in the chest, lower neck, and likely porta hepatis compatible with malignancy ; and a small  left upper lobe pulmonary nodule which is likewise hypermetabolic. The bony disease resembles myeloma but it would be unusual to have this much hepatic and soft tissue involvement in the setting of myeloma. Other possibilities might include an unusually aggressive central lung cancer. Tissue diagnosis recommended. 2. AP window mass is indistinct and may be interfering with the recurrent laryngeal nerve. The asymmetric appearance of the cords is probably secondary to this interference. 3. 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 Cholelithiasis.  4. US guided liver biopsy showed: DIAGNOSIS:  A. LIVER MASS, LEFT LOBE; ULTRASOUND GUIDED BIOPSY:  - METASTATIC NON-SMALL CELL CARCINOMA, TTF-1 POSITIVE.   Comment:  A limited panel of immunohistochemical stains was performed. The  metastatic malignant neoplasm demonstrates the following pattern of  immunoreactivity:  Super pancytokeratin: Positive  TTF-1: Positive  CD56: Negative  CDX-2: Negative  Stain controls worked appropriately. The morphologic findings are  consistent with metastatic poorly differentiated carcinoma. The presence  of TTF-1 positivity is compatible with lung origin.   5. Cycle 1 carbo/alimta given on 04/26/16. He gets monthly X geva. CEA elevated at 7641 at baseline. keytruda added to cycle 2  6. Foundation one testing showed no EGFR, ALK, ROS and BRAF mutations. PDL1 expression was 70%. No other actionable mutations  Interval history- RUQ abdominal pain well controlled. Denies any back pain. Still has fatigue but overall stable  ECOG PS- 1-2 Pain scale- 0 Opioid associated constipation- no  Review of systems- Review of Systems  Constitutional: Negative for chills, fever, malaise/fatigue and weight loss.  HENT: Negative for congestion, ear discharge and nosebleeds.  Eyes: Negative  for blurred vision.  Respiratory: Negative for cough, hemoptysis, sputum production, shortness of breath and wheezing.   Cardiovascular: Negative for chest pain, palpitations, orthopnea and claudication.  Gastrointestinal: Negative for abdominal pain, blood in stool, constipation, diarrhea, heartburn, melena, nausea and vomiting.  Genitourinary: Negative for dysuria, flank pain, frequency, hematuria and urgency.  Musculoskeletal: Negative for back pain, joint pain and myalgias.  Skin: Negative for rash.  Neurological: Negative for dizziness, tingling, focal weakness, seizures, weakness and headaches.  Endo/Heme/Allergies: Does not bruise/bleed easily.  Psychiatric/Behavioral: Negative for depression and suicidal ideas. The patient does not have insomnia.       No Known Allergies   Past Medical History:  Diagnosis Date  . Allergy   . Diabetes mellitus without complication ([place])   . Dyspnea   . Lung cancer ([place]) 03/2016   Chemo tx's.     Past Surgical History:  Procedure Laterality Date  . ADENOIDECTOMY    . COLONOSCOPY    . IR FLUORO GUIDE PORT INSERTION RIGHT  04/16/2016  . TONSILLECTOMY      Social History   Social History  . Marital status: Divorced    Spouse name: N/A  . Number of children: N/A  . Years of education: N/A   Occupational History  . Not on file.   Social History Main Topics  . Smoking status: Current Every Day Smoker    Packs/day: 1.00    Years: 40.00    Types: Cigarettes  . Smokeless tobacco: Never Used  . Alcohol use No  . Drug use: No  . Sexual activity: Not on file   Other Topics Concern  . Not on file   Social History Narrative  . No narrative on file    Family History  Problem Relation Age of Onset  . Cancer Mother   . Cancer Father      Current Outpatient Prescriptions:  .  chlorproMAZINE (THORAZINE) 25 MG tablet, Take 1 tablet (25 mg total) by mouth every 6 (six) hours as needed. (Patient not taking: Reported on 06/29/2016),  Disp: 30 tablet, Rfl: 0 .  dexamethasone (DECADRON) 4 MG tablet, Take 1 tab two times a day the day before Alimta chemo. Take 2 tabs two times a day starting the day after chemo for 3 days., Disp: 30 tablet, Rfl: 1 .  fexofenadine (ALLEGRA) 180 MG tablet, Take 1 tablet (180 mg total) by mouth daily., Disp: 90 tablet, Rfl: 2 .  fluticasone (FLONASE) 50 MCG/ACT nasal spray, Place 2 sprays into both nostrils daily., Disp: , Rfl:  .  folic acid (FOLVITE) 1 MG tablet, Take 1 tablet (1 mg total) by mouth daily. Start 5-7 days before Alimta chemotherapy. Continue until 21 days after Alimta completed., Disp: 100 tablet, Rfl: 3 .  lidocaine (LIDODERM) 5 %, Place 1 patch onto the skin daily. Remove & Discard patch within 12 hours or as directed by MD, Disp: 30 patch, Rfl: 0 .  lidocaine-prilocaine (EMLA) cream, Apply to affected area once, Disp: 30 g, Rfl: 3 .  metFORMIN (GLUCOPHAGE) 500 MG tablet, Take 1 tablet (500 mg total) by mouth 2 (two) times daily with a meal., Disp: 180 tablet, Rfl: 2 .  morphine (MS CONTIN) 15 MG 12 hr tablet, Take 1 tablet (15 mg total) by mouth every 12 (twelve) hours., Disp: 60 tablet, Rfl: 0 .  Morphine Sulfate (MORPHINE CONCENTRATE) 10 mg / 0.5 ml concentrated solution, Take by mouth every 4 (four) hours as needed for severe pain. 0.25 ml every  4 hours as needed (prescribed by Dr. Lew Dawes), Disp: , Rfl:  .  ondansetron (ZOFRAN) 8 MG tablet, Take 1 tablet (8 mg total) by mouth 2 (two) times daily as needed for refractory nausea / vomiting. Start on day 3 after chemo. (Patient not taking: Reported on 06/29/2016), Disp: 30 tablet, Rfl: 1 .  prochlorperazine (COMPAZINE) 10 MG tablet, Take 1 tablet (10 mg total) by mouth every 6 (six) hours as needed (Nausea or vomiting). (Patient not taking: Reported on 06/29/2016), Disp: 30 tablet, Rfl: 1 .  ranitidine (ZANTAC) 150 MG tablet, Take 1 tablet (150 mg total) by mouth 2 (two) times daily., Disp: 180 tablet, Rfl: 2 .  senna (SENOKOT) 8.6 MG  TABS tablet, Take 2 tablets (17.2 mg total) by mouth at bedtime., Disp: 120 each, Rfl: 0  Physical exam:  Vitals:   07/20/16 1046  BP: 106/69  Pulse: 76  Resp: 18  Temp: 98.2 F (36.8 C)  TempSrc: Tympanic  Weight: 163 lb 7 oz (74.1 kg)   Physical Exam  Constitutional: He is oriented to person, place, and time.  Thin tall man in no acute distress  HENT:  Head: Normocephalic and atraumatic.  Eyes: EOM are normal. Pupils are equal, round, and reactive to light.  Neck: Normal range of motion.  Cardiovascular: Normal rate, regular rhythm and normal heart sounds.   Pulmonary/Chest: Effort normal and breath sounds normal.  Abdominal: Soft. Bowel sounds are normal.  Neurological: He is alert and oriented to person, place, and time.  Skin: Skin is warm and dry.     CMP Latest Ref Rng & Units 07/20/2016  Glucose 65 - 99 mg/dL 83  BUN 6 - 20 mg/dL 9  Creatinine 0.61 - 1.24 mg/dL 0.39(L)  Sodium 135 - 145 mmol/L 135  Potassium 3.5 - 5.1 mmol/L 3.5  Chloride 101 - 111 mmol/L 100(L)  CO2 22 - 32 mmol/L 28  Calcium 8.9 - 10.3 mg/dL 8.6(L)  Total Protein 6.5 - 8.1 g/dL 6.5  Total Bilirubin 0.3 - 1.2 mg/dL 0.6  Alkaline Phos 38 - 126 U/L 118  AST 15 - 41 U/L 54(H)  ALT 17 - 63 U/L 56   CBC Latest Ref Rng & Units 07/20/2016  WBC 3.8 - 10.6 K/uL 6.1  Hemoglobin 13.0 - 18.0 g/dL 10.7(L)  Hematocrit 40.0 - 52.0 % 30.5(L)  Platelets 150 - 440 K/uL 177    No images are attached to the encounter.  Ct Chest W Contrast  Result Date: 07/13/2016 CLINICAL DATA:  66 year old male with history of lung cancer with known metastatic disease to liver an lymph nodes originally diagnosed in March 2018. Chemotherapy initiated in April 2018. Restaging examination. EXAM: CT CHEST, ABDOMEN, AND PELVIS WITH CONTRAST TECHNIQUE: Multidetector CT imaging of the chest, abdomen and pelvis was performed following the standard protocol during bolus administration of intravenous contrast. CONTRAST:  154m ISOVUE-300  IOPAMIDOL (ISOVUE-300) INJECTION 61% COMPARISON:  PET-CT 04/10/2016. FINDINGS: CT CHEST FINDINGS Cardiovascular: Heart size is normal. There is no significant pericardial fluid, thickening or pericardial calcification. There is aortic atherosclerosis, as well as atherosclerosis of the great vessels of the mediastinum and the coronary arteries, including calcified atherosclerotic plaque in the left main, left anterior descending, left circumflex and right coronary arteries. Mild aneurysmal dilatation of the ascending thoracic aorta (4.7 cm in diameter). Right internal jugular single-lumen porta cath with tip terminating in the superior aspect of the right atrium. Mediastinum/Nodes: There multiple enlarged mediastinal and bilateral hilar lymph nodes. The largest of these  include a 14 mm subcarinal lymph node (image 31 of series 2) and a 17 mm left hilar lymph node (axial image 35 of series 2). Esophagus is unremarkable in appearance. Lungs/Pleura: Previously noted left upper lobe pulmonary nodule has slightly decreased in size, currently measuring 11 x 8 mm (axial image 41 of series 4), previously 14 x 10 mm on 04/10/2016. Interval growth of a a 10 x 8 mm subpleural nodule in the posterior aspect of the right middle lobe (axial image 117 of series 4), which previously measured only 5 x 7 mm. No other definite suspicious appearing pulmonary nodules. Widespread septal thickening, peripheral bronchiolectasis and scattered areas of peribronchovascular ground-glass attenuation nodularity. These findings have a craniocaudal gradient, but there is some subpleural sparing. The peribronchovascular ground-glass attenuation nodularity is new compared to the prior study, most evident in the right upper lobe. No frank honeycombing identified. Diffuse bronchial wall thickening with mild centrilobular and paraseptal emphysema. Musculoskeletal: Numerous lytic lesions are again noted throughout the visualized axial and appendicular  skeleton, the largest of which is in the anterior aspect of the T9 vertebral body (axial image 107 of series 4) where there is a 1.6 x 1.7 cm lesion with anterior cortical destruction. CT ABDOMEN PELVIS FINDINGS Hepatobiliary: Innumerable hypovascular lesions are scattered throughout the liver, compatible with widespread metastatic disease, the largest of which include a 2.1 x 2.0 cm lesion in segment 4A (axial image 60 of series 2), and a 3.6 x 2.5 cm lesion between segments 5 and 8 (axial image 65 of series 2). No intra or extrahepatic biliary ductal dilatation. Numerous tiny calcified gallstones layer dependently in the gallbladder. No findings to suggest an acute cholecystitis at this time. Pancreas: No pancreatic mass. No pancreatic ductal dilatation. No pancreatic or peripancreatic fluid or inflammatory changes. Spleen: Unremarkable. Adrenals/Urinary Tract: Subcentimeter low-attenuation lesion in the posterior aspect of the interpolar region of the right kidney is too small to definitively characterize, but is statistically likely a cyst. Left kidney and bilateral adrenal glands are normal in appearance. No hydroureteronephrosis. Urinary bladder is normal in appearance. Stomach/Bowel: The appearance of the stomach is normal. There is no pathologic dilatation of small bowel or colon. Normal appendix. Vascular/Lymphatic: Aortic atherosclerosis, without evidence of aneurysm or dissection in the abdominal or pelvic vasculature. No definite lymphadenopathy identified in the abdomen or pelvis. Reproductive: Prostate gland and seminal vesicles are unremarkable in appearance. Other: No significant volume of ascites.  No pneumoperitoneum. Musculoskeletal: Numerous lytic lesions are again noted throughout the visualized axial and appendicular skeleton, compatible with widespread metastatic disease, largest of which measures up to 2.4 cm in the anterior aspect of the L4 vertebral body (axial image 90 of series 2).  IMPRESSION: 1. Mixed response to therapy. Specifically, although there has been slight regression of previously noted left upper lobe pulmonary nodule, extensive mediastinal and left hilar lymphadenopathy appears slightly progressive and there is been enlargement over right middle lobe pulmonary nodule. Previously noted widespread metastatic disease to the liver and bones appear similar to the prior examination, as discussed above. 2. Interval development of some peribronchovascular ground-glass attenuation nodularity in the lungs, most evident in the right upper lobe, likely infectious or inflammatory in etiology. 3. Aortic atherosclerosis, in addition to left main and 3 vessel coronary artery disease. 4. 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 Attention on routine followup studies is recommended. 5. Unusual appearance of the lungs. There is diffuse bronchial wall thickening with mild centrilobular  and paraseptal emphysema; imaging findings suggestive of underlying COPD. In addition, there are changes of chronic interstitial lung disease, which given the subpleural sparing is favored to reflect sequela of chronic cryptogenic organizing pneumonia (COP). Strictly speaking, developing usual interstitial pneumonia (UIP) is not entirely excluded, and attention on followup studies is recommended. 6. Additional incidental findings, as above. Aortic Atherosclerosis (ICD10-I70.0) and Emphysema (ICD10-J43.9). Electronically Signed   By: Vinnie Langton M.D.   On: 07/13/2016 10:28   Nm Bone Scan Whole Body  Result Date: 07/17/2016 CLINICAL DATA:  Metastatic lung cancer EXAM: NUCLEAR MEDICINE WHOLE BODY BONE SCAN TECHNIQUE: Whole body anterior and posterior images were obtained approximately 3 hours after intravenous injection of radiopharmaceutical. RADIOPHARMACEUTICALS:  23.2 mCi Technetium-2mMDP IV COMPARISON:  Chest, abdomen and pelvic CT 07/13/2016  FINDINGS: Multiple areas of abnormal bone uptake noted in the hips bilaterally compatible with metastasis. Mild increased activity in the upper thoracic spine and lower thoracic spine compatible with metastasis. The degree of spinal activity is not as significant as suspected based on CT appearance. Increased activity noted in the region of the sternoclavicular joints and AC joints, likely degenerative. Soft tissue activity unremarkable. IMPRESSION: Multiple areas of abnormal bony uptake in the ribs bilaterally most compatible with metastasis. Slight increased activity in the upper and lower thoracic spine at disease, but not as pronounced as would be expected based on CT. Electronically Signed   By: KRolm BaptiseM.D.   On: 07/17/2016 12:39   Ct Abdomen Pelvis W Contrast  Result Date: 07/13/2016 CLINICAL DATA:  66year old male with history of lung cancer with known metastatic disease to liver an lymph nodes originally diagnosed in March 2018. Chemotherapy initiated in April 2018. Restaging examination. EXAM: CT CHEST, ABDOMEN, AND PELVIS WITH CONTRAST TECHNIQUE: Multidetector CT imaging of the chest, abdomen and pelvis was performed following the standard protocol during bolus administration of intravenous contrast. CONTRAST:  1055mISOVUE-300 IOPAMIDOL (ISOVUE-300) INJECTION 61% COMPARISON:  PET-CT 04/10/2016. FINDINGS: CT CHEST FINDINGS Cardiovascular: Heart size is normal. There is no significant pericardial fluid, thickening or pericardial calcification. There is aortic atherosclerosis, as well as atherosclerosis of the great vessels of the mediastinum and the coronary arteries, including calcified atherosclerotic plaque in the left main, left anterior descending, left circumflex and right coronary arteries. Mild aneurysmal dilatation of the ascending thoracic aorta (4.7 cm in diameter). Right internal jugular single-lumen porta cath with tip terminating in the superior aspect of the right atrium.  Mediastinum/Nodes: There multiple enlarged mediastinal and bilateral hilar lymph nodes. The largest of these include a 14 mm subcarinal lymph node (image 31 of series 2) and a 17 mm left hilar lymph node (axial image 35 of series 2). Esophagus is unremarkable in appearance. Lungs/Pleura: Previously noted left upper lobe pulmonary nodule has slightly decreased in size, currently measuring 11 x 8 mm (axial image 41 of series 4), previously 14 x 10 mm on 04/10/2016. Interval growth of a a 10 x 8 mm subpleural nodule in the posterior aspect of the right middle lobe (axial image 117 of series 4), which previously measured only 5 x 7 mm. No other definite suspicious appearing pulmonary nodules. Widespread septal thickening, peripheral bronchiolectasis and scattered areas of peribronchovascular ground-glass attenuation nodularity. These findings have a craniocaudal gradient, but there is some subpleural sparing. The peribronchovascular ground-glass attenuation nodularity is new compared to the prior study, most evident in the right upper lobe. No frank honeycombing identified. Diffuse bronchial wall thickening with mild centrilobular and paraseptal emphysema. Musculoskeletal: Numerous lytic  lesions are again noted throughout the visualized axial and appendicular skeleton, the largest of which is in the anterior aspect of the T9 vertebral body (axial image 107 of series 4) where there is a 1.6 x 1.7 cm lesion with anterior cortical destruction. CT ABDOMEN PELVIS FINDINGS Hepatobiliary: Innumerable hypovascular lesions are scattered throughout the liver, compatible with widespread metastatic disease, the largest of which include a 2.1 x 2.0 cm lesion in segment 4A (axial image 60 of series 2), and a 3.6 x 2.5 cm lesion between segments 5 and 8 (axial image 65 of series 2). No intra or extrahepatic biliary ductal dilatation. Numerous tiny calcified gallstones layer dependently in the gallbladder. No findings to suggest an  acute cholecystitis at this time. Pancreas: No pancreatic mass. No pancreatic ductal dilatation. No pancreatic or peripancreatic fluid or inflammatory changes. Spleen: Unremarkable. Adrenals/Urinary Tract: Subcentimeter low-attenuation lesion in the posterior aspect of the interpolar region of the right kidney is too small to definitively characterize, but is statistically likely a cyst. Left kidney and bilateral adrenal glands are normal in appearance. No hydroureteronephrosis. Urinary bladder is normal in appearance. Stomach/Bowel: The appearance of the stomach is normal. There is no pathologic dilatation of small bowel or colon. Normal appendix. Vascular/Lymphatic: Aortic atherosclerosis, without evidence of aneurysm or dissection in the abdominal or pelvic vasculature. No definite lymphadenopathy identified in the abdomen or pelvis. Reproductive: Prostate gland and seminal vesicles are unremarkable in appearance. Other: No significant volume of ascites.  No pneumoperitoneum. Musculoskeletal: Numerous lytic lesions are again noted throughout the visualized axial and appendicular skeleton, compatible with widespread metastatic disease, largest of which measures up to 2.4 cm in the anterior aspect of the L4 vertebral body (axial image 90 of series 2). IMPRESSION: 1. Mixed response to therapy. Specifically, although there has been slight regression of previously noted left upper lobe pulmonary nodule, extensive mediastinal and left hilar lymphadenopathy appears slightly progressive and there is been enlargement over right middle lobe pulmonary nodule. Previously noted widespread metastatic disease to the liver and bones appear similar to the prior examination, as discussed above. 2. Interval development of some peribronchovascular ground-glass attenuation nodularity in the lungs, most evident in the right upper lobe, likely infectious or inflammatory in etiology. 3. Aortic atherosclerosis, in addition to left main  and 3 vessel coronary artery disease. 4. 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 Attention on routine followup studies is recommended. 5. Unusual appearance of the lungs. There is diffuse bronchial wall thickening with mild centrilobular and paraseptal emphysema; imaging findings suggestive of underlying COPD. In addition, there are changes of chronic interstitial lung disease, which given the subpleural sparing is favored to reflect sequela of chronic cryptogenic organizing pneumonia (COP). Strictly speaking, developing usual interstitial pneumonia (UIP) is not entirely excluded, and attention on followup studies is recommended. 6. Additional incidental findings, as above. Aortic Atherosclerosis (ICD10-I70.0) and Emphysema (ICD10-J43.9). Electronically Signed   By: Vinnie Langton M.D.   On: 07/13/2016 10:28     Assessment and plan- Patient is a 66 y.o. male with stage IV TTF1 positive lung cancer with extensive mets to LN, bone and liver here for on treatment assessment prior to cycle 1 of maintenance alimta and keytruda  I have reviewed Ct images with radiology at tumor board yesterday and also reviewed it independently. There has been some mixed response to therapy. Adenopathy stable to slightly worse. Liver lesions about the same and lung lesions appear smaller. Overall he has stable  disease. iw ould therefore like to proceed with maintenance alimta/keytruda at this point especially given that PDL1 expression was 70% and he has received only 3 doses of [place]. I would not like to change treatment prematurely. Will repeat scans after 3 months and decide about switch. CEA today pending.   Neoplasm related pain- continue pain meds  Counts ok to proceed with cycle 1 of maintenance alimta/keytruda today. rtc in 3 weeks with cbc, cmp and CEA for cycle 2 of treatment. He also gets xgeva on that day   Visit Diagnosis 1. Primary malignant  neoplasm of lung metastatic to other site, unspecified laterality ([place])   2. Bone metastases ([place])   3. Neoplasm related pain   4. Encounter for antineoplastic chemotherapy   5. Encounter for antineoplastic immunotherapy      Dr. Randa Evens, MD, MPH [facility] Pager- [phone] 07/20/2016 12:45 PM

## 2016-07-20 NOTE — Progress Notes (Signed)
Patient offers no complaints today. 

## 2016-07-21 LAB — CEA: CEA1: 8280 ng/mL — AB (ref 0.0–4.7)

## 2016-07-23 ENCOUNTER — Telehealth: Payer: Self-pay

## 2016-07-23 NOTE — Telephone Encounter (Signed)
Nutrition Follow-up:  Spoke with patient via phone for nutrition follow-up.    Patient reports that he has been eating "anything I want."  Reports has had 1 episode of vomiting but took medication and had relief.  Reports that he has been trying to drink 3-4 oral nutrition supplements (glucerna, ensure plus, equate shake).  Reports some constipation but takes senna to help.  No trouble swallowing per patient.  Reports has been eating egg, cheese, bacon biscuit or sausage biscuit for breakfast, sandwich or beef stew or barbecue for lunch and dinner.  Reports had 2 hot dogs today for lunch and just drank 2 shakes.    Medications: reviewed  Labs: reviewed  Anthropometrics:   Weight 163 lb 7 oz slight increase from 162 lb 3.2 oz on 6/4    NUTRITION DIAGNOSIS: Inadequate oral intake improving   MALNUTRITION DIAGNOSIS: severe malnutrition improving with weight gain   INTERVENTION:   Encourage patient to continue to drink oral nutrition supplements 3-4 per day in increase calories and protein.   Discussed continuing to choose foods high in calories and protein.      MONITORING, EVALUATION, GOAL: Patient will consume adequate calories to minimize weight loss   NEXT VISIT: as needed  Saima Monterroso B. Zenia Resides, [place], [place] Registered Dietitian [phone] (pager)

## 2016-08-01 ENCOUNTER — Telehealth: Payer: Self-pay | Admitting: *Deleted

## 2016-08-01 ENCOUNTER — Other Ambulatory Visit: Payer: Self-pay | Admitting: *Deleted

## 2016-08-01 DIAGNOSIS — C3490 Malignant neoplasm of unspecified part of unspecified bronchus or lung: Secondary | ICD-10-CM

## 2016-08-01 DIAGNOSIS — Z5111 Encounter for antineoplastic chemotherapy: Secondary | ICD-10-CM

## 2016-08-01 MED ORDER — MORPHINE SULFATE ER 15 MG PO TBCR: 15.0000 mg | EXTENDED_RELEASE_TABLET | Freq: Two times a day (BID) | ORAL | 0 refills | Status: AC

## 2016-08-01 NOTE — Telephone Encounter (Signed)
Pt. Came in to get refill of mscontin.  He last got it 6/15, I refilled it and Dr. B signed and I spoke to pt to call ahead to get refills and he will not have to wait next time. He has a very hoarse voice and he feels that no one can hear him on the phone to tell us he needs refill. I told him that he can call me directly and and I will try to listen and I will try to remember that it is him and I can call him. He will try next time. rx given to pt.

## 2016-08-10 ENCOUNTER — Inpatient Hospital Stay: Payer: Medicare HMO

## 2016-08-10 ENCOUNTER — Inpatient Hospital Stay (HOSPITAL_BASED_OUTPATIENT_CLINIC_OR_DEPARTMENT_OTHER): Payer: Medicare HMO | Admitting: Oncology

## 2016-08-10 ENCOUNTER — Other Ambulatory Visit: Payer: Self-pay | Admitting: *Deleted

## 2016-08-10 VITALS — BP 116/85 | HR 103 | Temp 98.1°F | Resp 16 | Wt 159.9 lb

## 2016-08-10 DIAGNOSIS — G893 Neoplasm related pain (acute) (chronic): Secondary | ICD-10-CM

## 2016-08-10 DIAGNOSIS — R590 Localized enlarged lymph nodes: Secondary | ICD-10-CM | POA: Diagnosis not present

## 2016-08-10 DIAGNOSIS — Z5112 Encounter for antineoplastic immunotherapy: Secondary | ICD-10-CM

## 2016-08-10 DIAGNOSIS — J3801 Paralysis of vocal cords and larynx, unilateral: Secondary | ICD-10-CM

## 2016-08-10 DIAGNOSIS — R634 Abnormal weight loss: Secondary | ICD-10-CM

## 2016-08-10 DIAGNOSIS — C7951 Secondary malignant neoplasm of bone: Secondary | ICD-10-CM

## 2016-08-10 DIAGNOSIS — I700 Atherosclerosis of aorta: Secondary | ICD-10-CM

## 2016-08-10 DIAGNOSIS — I712 Thoracic aortic aneurysm, without rupture: Secondary | ICD-10-CM

## 2016-08-10 DIAGNOSIS — F17210 Nicotine dependence, cigarettes, uncomplicated: Secondary | ICD-10-CM

## 2016-08-10 DIAGNOSIS — K8020 Calculus of gallbladder without cholecystitis without obstruction: Secondary | ICD-10-CM | POA: Diagnosis not present

## 2016-08-10 DIAGNOSIS — R0602 Shortness of breath: Secondary | ICD-10-CM

## 2016-08-10 DIAGNOSIS — Z9221 Personal history of antineoplastic chemotherapy: Secondary | ICD-10-CM

## 2016-08-10 DIAGNOSIS — Z5111 Encounter for antineoplastic chemotherapy: Secondary | ICD-10-CM

## 2016-08-10 DIAGNOSIS — C3490 Malignant neoplasm of unspecified part of unspecified bronchus or lung: Secondary | ICD-10-CM | POA: Diagnosis not present

## 2016-08-10 DIAGNOSIS — C787 Secondary malignant neoplasm of liver and intrahepatic bile duct: Secondary | ICD-10-CM

## 2016-08-10 DIAGNOSIS — Z7984 Long term (current) use of oral hypoglycemic drugs: Secondary | ICD-10-CM

## 2016-08-10 DIAGNOSIS — I2510 Atherosclerotic heart disease of native coronary artery without angina pectoris: Secondary | ICD-10-CM | POA: Diagnosis not present

## 2016-08-10 DIAGNOSIS — E119 Type 2 diabetes mellitus without complications: Secondary | ICD-10-CM | POA: Diagnosis not present

## 2016-08-10 DIAGNOSIS — J439 Emphysema, unspecified: Secondary | ICD-10-CM

## 2016-08-10 DIAGNOSIS — Z79899 Other long term (current) drug therapy: Secondary | ICD-10-CM

## 2016-08-10 LAB — COMPREHENSIVE METABOLIC PANEL
ALBUMIN: 3.4 g/dL — AB (ref 3.5–5.0)
ALK PHOS: 201 U/L — AB (ref 38–126)
ALT: 63 U/L (ref 17–63)
ANION GAP: 10 (ref 5–15)
AST: 76 U/L — AB (ref 15–41)
BUN: 14 mg/dL (ref 6–20)
CALCIUM: 9.2 mg/dL (ref 8.9–10.3)
CO2: 25 mmol/L (ref 22–32)
Chloride: 97 mmol/L — ABNORMAL LOW (ref 101–111)
Creatinine, Ser: 0.72 mg/dL (ref 0.61–1.24)
GFR calc Af Amer: >60 mL/min (ref >60)
GFR calc non Af Amer: >60 mL/min (ref >60)
GLUCOSE: 138 mg/dL — AB (ref 65–99)
POTASSIUM: 3.8 mmol/L (ref 3.5–5.1)
SODIUM: 132 mmol/L — AB (ref 135–145)
Total Bilirubin: 0.7 mg/dL (ref 0.3–1.2)
Total Protein: 7.2 g/dL (ref 6.5–8.1)

## 2016-08-10 LAB — CBC WITH DIFFERENTIAL/PLATELET
BASOS ABS: 0.1 10*3/uL (ref 0–0.1)
BASOS PCT: 0 %
EOS ABS: 0.1 10*3/uL (ref 0–0.7)
Eosinophils Relative: 1 %
HCT: 33.5 % — ABNORMAL LOW (ref 40.0–52.0)
HEMOGLOBIN: 11.6 g/dL — AB (ref 13.0–18.0)
Lymphocytes Relative: 9 %
Lymphs Abs: 1.2 10*3/uL (ref 1.0–3.6)
MCH: 34.9 pg — ABNORMAL HIGH (ref 26.0–34.0)
MCHC: 34.7 g/dL (ref 32.0–36.0)
MCV: 100.7 fL — ABNORMAL HIGH (ref 80.0–100.0)
MONOS PCT: 13 %
Monocytes Absolute: 1.8 10*3/uL — ABNORMAL HIGH (ref 0.2–1.0)
NEUTROS PCT: 77 %
Neutro Abs: 10.6 10*3/uL — ABNORMAL HIGH (ref 1.4–6.5)
Platelets: 161 10*3/uL (ref 150–440)
RBC: 3.32 MIL/uL — ABNORMAL LOW (ref 4.40–5.90)
RDW: 18.9 % — ABNORMAL HIGH (ref 11.5–14.5)
WBC: 13.8 10*3/uL — AB (ref 3.8–10.6)

## 2016-08-10 MED ORDER — SODIUM CHLORIDE 0.9 % IV SOLN
500.0000 mg/m2 | Freq: Once | INTRAVENOUS | Status: AC
  Administered 2016-08-10: 1000 mg via INTRAVENOUS
  Filled 2016-08-10: qty 40

## 2016-08-10 MED ORDER — SODIUM CHLORIDE 0.9% FLUSH
10.0000 mL | INTRAVENOUS | Status: DC | PRN
  Administered 2016-08-10: 10 mL
  Filled 2016-08-10: qty 10

## 2016-08-10 MED ORDER — DEXAMETHASONE SODIUM PHOSPHATE 10 MG/ML IJ SOLN
10.0000 mg | Freq: Once | INTRAMUSCULAR | Status: AC
  Administered 2016-08-10: 10 mg via INTRAVENOUS
  Filled 2016-08-10: qty 1

## 2016-08-10 MED ORDER — SODIUM CHLORIDE 0.9 % IV SOLN
200.0000 mg | Freq: Once | INTRAVENOUS | Status: AC
  Administered 2016-08-10: 200 mg via INTRAVENOUS
  Filled 2016-08-10: qty 8

## 2016-08-10 MED ORDER — DENOSUMAB 120 MG/1.7ML ~~LOC~~ SOLN
120.0000 mg | Freq: Once | SUBCUTANEOUS | Status: AC
  Administered 2016-08-10: 120 mg via SUBCUTANEOUS
  Filled 2016-08-10: qty 1.7

## 2016-08-10 MED ORDER — PALONOSETRON HCL INJECTION 0.25 MG/5ML
0.2500 mg | Freq: Once | INTRAVENOUS | Status: AC
  Administered 2016-08-10: 0.25 mg via INTRAVENOUS
  Filled 2016-08-10: qty 5

## 2016-08-10 MED ORDER — SODIUM CHLORIDE 0.9 % IV SOLN
Freq: Once | INTRAVENOUS | Status: AC
  Administered 2016-08-10: 11:00:00 via INTRAVENOUS
  Filled 2016-08-10: qty 1000

## 2016-08-10 MED ORDER — HEPARIN SOD (PORK) LOCK FLUSH 100 UNIT/ML IV SOLN
500.0000 [IU] | Freq: Once | INTRAVENOUS | Status: AC | PRN
  Administered 2016-08-10: 500 [IU]
  Filled 2016-08-10: qty 5

## 2016-08-10 NOTE — Progress Notes (Signed)
Patient was seen by PCP for possible foot injection and prescribed antibiotic.  He did go for follow up with PCP and his foot looks better.

## 2016-08-10 NOTE — Progress Notes (Signed)
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 Patient is a 66 year old male who was seen by Dr. walk from ENT for symptoms of hoarseness of voice for 1 month. 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 Patient is a chronic smoker and has smoked about 1-1/2 packs per day for more than 30 years. He has lost about 17 pounds of weight over the last 6-7 months. Patient lives with his sister and is independent of hisADLs and IADLs.  2. CT soft tissue of the neck on 04/02/2016 showed: 1. Soft tissue mass along the undersurface of the aortic arch is most concerning for a metastatic adenopathy. This likely impacts the left recurrent laryngeal nerve leading to vocal cord paralysis. 2. Extensive mediastinal adenopathy compatible with metastatic disease. 3. Numerous lytic lesions throughout the visualized is cervical and thoracic spine as well is bilateral ribs. These are compatible with additional metastases. 4. 12 mm pleural-based nodule in the left upper lid may represent a primary bronchogenic neoplasm. This is more likely metastatic disease. 5. Findings are most concerning for a primary lung cancer. Recommend CT of the chest, abdomen, and pelvis with contrast for further evaluation of the primary neoplasm and extent of metastatic disease.  3. PET CT scan on 04/10/2016 showed:IMPRESSION:1. Extensive lytic malignancy in the skeleton with heavy burden of hepatic metastatic lesions, adenopathy in the chest, lower neck, and likely porta hepatis compatible with malignancy ; and a small  left upper lobe pulmonary nodule which is likewise hypermetabolic. The bony disease resembles myeloma but it would be unusual to have this much hepatic and soft tissue involvement in the setting of myeloma. Other possibilities might include an unusually aggressive central lung cancer. Tissue diagnosis recommended. 2. AP window mass is indistinct and may be interfering with the recurrent laryngeal nerve. The asymmetric appearance of the cords is probably secondary to this interference. 3. 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 Cholelithiasis.  4. US guided liver biopsy showed: DIAGNOSIS:  A. LIVER MASS, LEFT LOBE; ULTRASOUND GUIDED BIOPSY:  - METASTATIC NON-SMALL CELL CARCINOMA, TTF-1 POSITIVE.   Comment:  A limited panel of immunohistochemical stains was performed. The  metastatic malignant neoplasm demonstrates the following pattern of  immunoreactivity:  Super pancytokeratin: Positive  TTF-1: Positive  CD56: Negative  CDX-2: Negative  Stain controls worked appropriately. The morphologic findings are  consistent with metastatic poorly differentiated carcinoma. The presence  of TTF-1 positivity is compatible with lung origin.   5. Cycle 1 carbo/alimta given on 04/26/16. He gets monthly X geva. CEA elevated at 7641 at baseline. keytruda added to cycle 2  6. Foundation one testing showed no EGFR, ALK, ROS and BRAF mutations. PDL1 expression was 70%. No other actionable mutations  7. Interim scans after 4 cycles of chemo/immunotherapy showed: Marland Kitchen Mixed response to therapy. Specifically, although there has been slight regression of previously noted left upper lobe pulmonary nodule, extensive mediastinal and left hilar lymphadenopathy appears slightly progressive and there is been enlargement over right middle lobe pulmonary nodule. Previously noted  widespread metastatic disease  to the liver and bones appear similar to the prior examination,   Interval history- energy levels are the same. Pain and nausea is well controlled. Denies any constipation  ECOG PS- 2 Pain scale- 0 Opioid associated constipation- no  Review of systems- Review of Systems  Constitutional: Positive for malaise/fatigue. Negative for chills, fever and weight loss.  HENT: Negative for congestion, ear discharge and nosebleeds.        Hoarseness of voice  Eyes: Negative for blurred vision.  Respiratory: Negative for cough, hemoptysis, sputum production, shortness of breath and wheezing.   Cardiovascular: Negative for chest pain, palpitations, orthopnea and claudication.  Gastrointestinal: Negative for abdominal pain, blood in stool, constipation, diarrhea, heartburn, melena, nausea and vomiting.  Genitourinary: Negative for dysuria, flank pain, frequency, hematuria and urgency.  Musculoskeletal: Negative for back pain, joint pain and myalgias.  Skin: Negative for rash.  Neurological: Negative for dizziness, tingling, focal weakness, seizures, weakness and headaches.  Endo/Heme/Allergies: Does not bruise/bleed easily.  Psychiatric/Behavioral: Negative for depression and suicidal ideas. The patient does not have insomnia.       No Known Allergies   Past Medical History:  Diagnosis Date  . Allergy   . Diabetes mellitus without complication ([place])   . Dyspnea   . Lung cancer ([place]) 03/2016   Chemo tx's.     Past Surgical History:  Procedure Laterality Date  . ADENOIDECTOMY    . COLONOSCOPY    . IR FLUORO GUIDE PORT INSERTION RIGHT  04/16/2016  . TONSILLECTOMY      Social History   Social History  . Marital status: Divorced    Spouse name: N/A  . Number of children: N/A  . Years of education: N/A   Occupational History  . Not on file.   Social History Main Topics  . Smoking status: Current Every Day Smoker    Packs/day: 1.00    Years: 40.00      Types: Cigarettes  . Smokeless tobacco: Never Used  . Alcohol use No  . Drug use: No  . Sexual activity: Not on file   Other Topics Concern  . Not on file   Social History Narrative  . No narrative on file    Family History  Problem Relation Age of Onset  . Cancer Mother   . Cancer Father      Current Outpatient Prescriptions:  .  chlorproMAZINE (THORAZINE) 25 MG tablet, Take 1 tablet (25 mg total) by mouth every 6 (six) hours as needed. (Patient not taking: Reported on 06/29/2016), Disp: 30 tablet, Rfl: 0 .  dexamethasone (DECADRON) 4 MG tablet, Take 1 tab two times a day the day before Alimta chemo. Take 2 tabs two times a day starting the day after chemo for 3 days., Disp: 30 tablet, Rfl: 1 .  fexofenadine (ALLEGRA) 180 MG tablet, Take 1 tablet (180 mg total) by mouth daily., Disp: 90 tablet, Rfl: 2 .  fluticasone (FLONASE) 50 MCG/ACT nasal spray, Place 2 sprays into both nostrils daily., Disp: , Rfl:  .  folic acid (FOLVITE) 1 MG tablet, Take 1 tablet (1 mg total) by mouth daily. Start 5-7 days before Alimta chemotherapy. Continue until 21 days after Alimta completed., Disp: 100 tablet, Rfl: 3 .  lidocaine (LIDODERM) 5 %, Place 1 patch onto the skin daily. Remove & Discard patch within 12 hours or as directed by MD, Disp: 30 patch, Rfl: 0 .  lidocaine-prilocaine (EMLA) cream, Apply to affected area once, Disp: 30 g, Rfl: 3 .  metFORMIN (  GLUCOPHAGE) 500 MG tablet, Take 1 tablet (500 mg total) by mouth 2 (two) times daily with a meal., Disp: 180 tablet, Rfl: 2 .  morphine (MS CONTIN) 15 MG 12 hr tablet, Take 1 tablet (15 mg total) by mouth every 12 (twelve) hours., Disp: 60 tablet, Rfl: 0 .  Morphine Sulfate (MORPHINE CONCENTRATE) 10 mg / 0.5 ml concentrated solution, Take by mouth every 4 (four) hours as needed for severe pain. 0.25 ml every 4 hours as needed (prescribed by Dr. Lew Dawes), Disp: , Rfl:  .  ondansetron (ZOFRAN) 8 MG tablet, Take 1 tablet (8 mg total) by mouth 2  (two) times daily as needed for refractory nausea / vomiting. Start on day 3 after chemo., Disp: 30 tablet, Rfl: 1 .  prochlorperazine (COMPAZINE) 10 MG tablet, Take 1 tablet (10 mg total) by mouth every 6 (six) hours as needed (Nausea or vomiting). (Patient not taking: Reported on 06/29/2016), Disp: 30 tablet, Rfl: 1 .  ranitidine (ZANTAC) 150 MG tablet, Take 1 tablet (150 mg total) by mouth 2 (two) times daily., Disp: 180 tablet, Rfl: 2 .  senna (SENOKOT) 8.6 MG TABS tablet, Take 2 tablets (17.2 mg total) by mouth at bedtime., Disp: 120 each, Rfl: 0  Physical exam:  Vitals:   08/10/16 1004  BP: 116/85  Pulse: (!) 103  Resp: 16  Temp: 98.1 F (36.7 C)  Weight: 159 lb 14.4 oz (72.5 kg)   Physical Exam  Constitutional: He is oriented to person, place, and time and well-developed, well-nourished, and in no distress.  HENT:  Head: Normocephalic and atraumatic.  Eyes: Pupils are equal, round, and reactive to light. EOM are normal.  Neck: Normal range of motion.  Cardiovascular: Regular rhythm and normal heart sounds.   tachycardic  Pulmonary/Chest: Effort normal and breath sounds normal.  Abdominal: Soft. Bowel sounds are normal.  Neurological: He is alert and oriented to person, place, and time.  Skin: Skin is warm and dry.     CMP Latest Ref Rng & Units 07/20/2016  Glucose 65 - 99 mg/dL 83  BUN 6 - 20 mg/dL 9  Creatinine 0.61 - 1.24 mg/dL 0.39(L)  Sodium 135 - 145 mmol/L 135  Potassium 3.5 - 5.1 mmol/L 3.5  Chloride 101 - 111 mmol/L 100(L)  CO2 22 - 32 mmol/L 28  Calcium 8.9 - 10.3 mg/dL 8.6(L)  Total Protein 6.5 - 8.1 g/dL 6.5  Total Bilirubin 0.3 - 1.2 mg/dL 0.6  Alkaline Phos 38 - 126 U/L 118  AST 15 - 41 U/L 54(H)  ALT 17 - 63 U/L 56   CBC Latest Ref Rng & Units 07/20/2016  WBC 3.8 - 10.6 K/uL 6.1  Hemoglobin 13.0 - 18.0 g/dL 10.7(L)  Hematocrit 40.0 - 52.0 % 30.5(L)  Platelets 150 - 440 K/uL 177    No images are attached to the encounter.  Ct Chest W  Contrast  Result Date: 07/13/2016 CLINICAL DATA:  66 year old male with history of lung cancer with known metastatic disease to liver an lymph nodes originally diagnosed in March 2018. Chemotherapy initiated in April 2018. Restaging examination. EXAM: CT CHEST, ABDOMEN, AND PELVIS WITH CONTRAST TECHNIQUE: Multidetector CT imaging of the chest, abdomen and pelvis was performed following the standard protocol during bolus administration of intravenous contrast. CONTRAST:  180m ISOVUE-300 IOPAMIDOL (ISOVUE-300) INJECTION 61% COMPARISON:  PET-CT 04/10/2016. FINDINGS: CT CHEST FINDINGS Cardiovascular: Heart size is normal. There is no significant pericardial fluid, thickening or pericardial calcification. There is aortic atherosclerosis, as well as atherosclerosis of  the great vessels of the mediastinum and the coronary arteries, including calcified atherosclerotic plaque in the left main, left anterior descending, left circumflex and right coronary arteries. Mild aneurysmal dilatation of the ascending thoracic aorta (4.7 cm in diameter). Right internal jugular single-lumen porta cath with tip terminating in the superior aspect of the right atrium. Mediastinum/Nodes: There multiple enlarged mediastinal and bilateral hilar lymph nodes. The largest of these include a 14 mm subcarinal lymph node (image 31 of series 2) and a 17 mm left hilar lymph node (axial image 35 of series 2). Esophagus is unremarkable in appearance. Lungs/Pleura: Previously noted left upper lobe pulmonary nodule has slightly decreased in size, currently measuring 11 x 8 mm (axial image 41 of series 4), previously 14 x 10 mm on 04/10/2016. Interval growth of a a 10 x 8 mm subpleural nodule in the posterior aspect of the right middle lobe (axial image 117 of series 4), which previously measured only 5 x 7 mm. No other definite suspicious appearing pulmonary nodules. Widespread septal thickening, peripheral bronchiolectasis and scattered areas of  peribronchovascular ground-glass attenuation nodularity. These findings have a craniocaudal gradient, but there is some subpleural sparing. The peribronchovascular ground-glass attenuation nodularity is new compared to the prior study, most evident in the right upper lobe. No frank honeycombing identified. Diffuse bronchial wall thickening with mild centrilobular and paraseptal emphysema. Musculoskeletal: Numerous lytic lesions are again noted throughout the visualized axial and appendicular skeleton, the largest of which is in the anterior aspect of the T9 vertebral body (axial image 107 of series 4) where there is a 1.6 x 1.7 cm lesion with anterior cortical destruction. CT ABDOMEN PELVIS FINDINGS Hepatobiliary: Innumerable hypovascular lesions are scattered throughout the liver, compatible with widespread metastatic disease, the largest of which include a 2.1 x 2.0 cm lesion in segment 4A (axial image 60 of series 2), and a 3.6 x 2.5 cm lesion between segments 5 and 8 (axial image 65 of series 2). No intra or extrahepatic biliary ductal dilatation. Numerous tiny calcified gallstones layer dependently in the gallbladder. No findings to suggest an acute cholecystitis at this time. Pancreas: No pancreatic mass. No pancreatic ductal dilatation. No pancreatic or peripancreatic fluid or inflammatory changes. Spleen: Unremarkable. Adrenals/Urinary Tract: Subcentimeter low-attenuation lesion in the posterior aspect of the interpolar region of the right kidney is too small to definitively characterize, but is statistically likely a cyst. Left kidney and bilateral adrenal glands are normal in appearance. No hydroureteronephrosis. Urinary bladder is normal in appearance. Stomach/Bowel: The appearance of the stomach is normal. There is no pathologic dilatation of small bowel or colon. Normal appendix. Vascular/Lymphatic: Aortic atherosclerosis, without evidence of aneurysm or dissection in the abdominal or pelvic  vasculature. No definite lymphadenopathy identified in the abdomen or pelvis. Reproductive: Prostate gland and seminal vesicles are unremarkable in appearance. Other: No significant volume of ascites.  No pneumoperitoneum. Musculoskeletal: Numerous lytic lesions are again noted throughout the visualized axial and appendicular skeleton, compatible with widespread metastatic disease, largest of which measures up to 2.4 cm in the anterior aspect of the L4 vertebral body (axial image 90 of series 2). IMPRESSION: 1. Mixed response to therapy. Specifically, although there has been slight regression of previously noted left upper lobe pulmonary nodule, extensive mediastinal and left hilar lymphadenopathy appears slightly progressive and there is been enlargement over right middle lobe pulmonary nodule. Previously noted widespread metastatic disease to the liver and bones appear similar to the prior examination, as discussed above. 2. Interval development of some peribronchovascular ground-glass  attenuation nodularity in the lungs, most evident in the right upper lobe, likely infectious or inflammatory in etiology. 3. Aortic atherosclerosis, in addition to left main and 3 vessel coronary artery disease. 4. 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 Attention on routine followup studies is recommended. 5. Unusual appearance of the lungs. There is diffuse bronchial wall thickening with mild centrilobular and paraseptal emphysema; imaging findings suggestive of underlying COPD. In addition, there are changes of chronic interstitial lung disease, which given the subpleural sparing is favored to reflect sequela of chronic cryptogenic organizing pneumonia (COP). Strictly speaking, developing usual interstitial pneumonia (UIP) is not entirely excluded, and attention on followup studies is recommended. 6. Additional incidental findings, as above. Aortic Atherosclerosis  (ICD10-I70.0) and Emphysema (ICD10-J43.9). Electronically Signed   By: Vinnie Langton M.D.   On: 07/13/2016 10:28   Nm Bone Scan Whole Body  Result Date: 07/17/2016 CLINICAL DATA:  Metastatic lung cancer EXAM: NUCLEAR MEDICINE WHOLE BODY BONE SCAN TECHNIQUE: Whole body anterior and posterior images were obtained approximately 3 hours after intravenous injection of radiopharmaceutical. RADIOPHARMACEUTICALS:  23.2 mCi Technetium-58mMDP IV COMPARISON:  Chest, abdomen and pelvic CT 07/13/2016 FINDINGS: Multiple areas of abnormal bone uptake noted in the hips bilaterally compatible with metastasis. Mild increased activity in the upper thoracic spine and lower thoracic spine compatible with metastasis. The degree of spinal activity is not as significant as suspected based on CT appearance. Increased activity noted in the region of the sternoclavicular joints and AC joints, likely degenerative. Soft tissue activity unremarkable. IMPRESSION: Multiple areas of abnormal bony uptake in the ribs bilaterally most compatible with metastasis. Slight increased activity in the upper and lower thoracic spine at disease, but not as pronounced as would be expected based on CT. Electronically Signed   By: KRolm BaptiseM.D.   On: 07/17/2016 12:39   Ct Abdomen Pelvis W Contrast  Result Date: 07/13/2016 CLINICAL DATA:  66year old male with history of lung cancer with known metastatic disease to liver an lymph nodes originally diagnosed in March 2018. Chemotherapy initiated in April 2018. Restaging examination. EXAM: CT CHEST, ABDOMEN, AND PELVIS WITH CONTRAST TECHNIQUE: Multidetector CT imaging of the chest, abdomen and pelvis was performed following the standard protocol during bolus administration of intravenous contrast. CONTRAST:  1056mISOVUE-300 IOPAMIDOL (ISOVUE-300) INJECTION 61% COMPARISON:  PET-CT 04/10/2016. FINDINGS: CT CHEST FINDINGS Cardiovascular: Heart size is normal. There is no significant pericardial fluid,  thickening or pericardial calcification. There is aortic atherosclerosis, as well as atherosclerosis of the great vessels of the mediastinum and the coronary arteries, including calcified atherosclerotic plaque in the left main, left anterior descending, left circumflex and right coronary arteries. Mild aneurysmal dilatation of the ascending thoracic aorta (4.7 cm in diameter). Right internal jugular single-lumen porta cath with tip terminating in the superior aspect of the right atrium. Mediastinum/Nodes: There multiple enlarged mediastinal and bilateral hilar lymph nodes. The largest of these include a 14 mm subcarinal lymph node (image 31 of series 2) and a 17 mm left hilar lymph node (axial image 35 of series 2). Esophagus is unremarkable in appearance. Lungs/Pleura: Previously noted left upper lobe pulmonary nodule has slightly decreased in size, currently measuring 11 x 8 mm (axial image 41 of series 4), previously 14 x 10 mm on 04/10/2016. Interval growth of a a 10 x 8 mm subpleural nodule in the posterior aspect of the right middle lobe (axial image 117 of series 4), which previously measured only 5 x  7 mm. No other definite suspicious appearing pulmonary nodules. Widespread septal thickening, peripheral bronchiolectasis and scattered areas of peribronchovascular ground-glass attenuation nodularity. These findings have a craniocaudal gradient, but there is some subpleural sparing. The peribronchovascular ground-glass attenuation nodularity is new compared to the prior study, most evident in the right upper lobe. No frank honeycombing identified. Diffuse bronchial wall thickening with mild centrilobular and paraseptal emphysema. Musculoskeletal: Numerous lytic lesions are again noted throughout the visualized axial and appendicular skeleton, the largest of which is in the anterior aspect of the T9 vertebral body (axial image 107 of series 4) where there is a 1.6 x 1.7 cm lesion with anterior cortical  destruction. CT ABDOMEN PELVIS FINDINGS Hepatobiliary: Innumerable hypovascular lesions are scattered throughout the liver, compatible with widespread metastatic disease, the largest of which include a 2.1 x 2.0 cm lesion in segment 4A (axial image 60 of series 2), and a 3.6 x 2.5 cm lesion between segments 5 and 8 (axial image 65 of series 2). No intra or extrahepatic biliary ductal dilatation. Numerous tiny calcified gallstones layer dependently in the gallbladder. No findings to suggest an acute cholecystitis at this time. Pancreas: No pancreatic mass. No pancreatic ductal dilatation. No pancreatic or peripancreatic fluid or inflammatory changes. Spleen: Unremarkable. Adrenals/Urinary Tract: Subcentimeter low-attenuation lesion in the posterior aspect of the interpolar region of the right kidney is too small to definitively characterize, but is statistically likely a cyst. Left kidney and bilateral adrenal glands are normal in appearance. No hydroureteronephrosis. Urinary bladder is normal in appearance. Stomach/Bowel: The appearance of the stomach is normal. There is no pathologic dilatation of small bowel or colon. Normal appendix. Vascular/Lymphatic: Aortic atherosclerosis, without evidence of aneurysm or dissection in the abdominal or pelvic vasculature. No definite lymphadenopathy identified in the abdomen or pelvis. Reproductive: Prostate gland and seminal vesicles are unremarkable in appearance. Other: No significant volume of ascites.  No pneumoperitoneum. Musculoskeletal: Numerous lytic lesions are again noted throughout the visualized axial and appendicular skeleton, compatible with widespread metastatic disease, largest of which measures up to 2.4 cm in the anterior aspect of the L4 vertebral body (axial image 90 of series 2). IMPRESSION: 1. Mixed response to therapy. Specifically, although there has been slight regression of previously noted left upper lobe pulmonary nodule, extensive mediastinal and  left hilar lymphadenopathy appears slightly progressive and there is been enlargement over right middle lobe pulmonary nodule. Previously noted widespread metastatic disease to the liver and bones appear similar to the prior examination, as discussed above. 2. Interval development of some peribronchovascular ground-glass attenuation nodularity in the lungs, most evident in the right upper lobe, likely infectious or inflammatory in etiology. 3. Aortic atherosclerosis, in addition to left main and 3 vessel coronary artery disease. 4. 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 Attention on routine followup studies is recommended. 5. Unusual appearance of the lungs. There is diffuse bronchial wall thickening with mild centrilobular and paraseptal emphysema; imaging findings suggestive of underlying COPD. In addition, there are changes of chronic interstitial lung disease, which given the subpleural sparing is favored to reflect sequela of chronic cryptogenic organizing pneumonia (COP). Strictly speaking, developing usual interstitial pneumonia (UIP) is not entirely excluded, and attention on followup studies is recommended. 6. Additional incidental findings, as above. Aortic Atherosclerosis (ICD10-I70.0) and Emphysema (ICD10-J43.9). Electronically Signed   By: Vinnie Langton M.D.   On: 07/13/2016 10:28     Assessment and plan- Patient is a 66 y.o. male with stage IV  TTF1 positive lung cancer with extensive mets to LN, bone and liver here for on treatment assessment prior to cycle 2 of maintenance alimta and keytruda  Counts ok to proceed with cycle 2 of maintenance alimta/keytruda today. He has had mixed response to treatment as above. CEA was going down initially and now trending back up which is concerning. I would like to give him 1 more cycle of maintenance alimta/ keytruda cycle 3 in 3 weeks and repeat CT chest abdomen pelvis and bone scan  after that.  Component     Latest Ref Rng & Units 04/26/2016 05/17/2016 06/08/2016 06/29/2016  CEA     0.0 - 4.7 ng/mL 7,641.0 (H) 5,143.0 (H) 3,780.0 (H) 5,499.0 (H)  CEA     0.0 - 4.7 ng/mL       Component     Latest Ref Rng & Units 07/20/2016  CEA     0.0 - 4.7 ng/mL   CEA     0.0 - 4.7 ng/mL 8,280.0 (H)   xgeva today  He will rtc in 3 weeks with cbc, cmp for cycle 3 of alimta/keytruda and see covering doc. I will see him back in 6 weeks with labs. Scans prior   Visit Diagnosis 1. Primary malignant neoplasm of lung metastatic to other site, unspecified laterality ([place])   2. Bone metastases ([place])   3. Encounter for antineoplastic immunotherapy      Dr. Randa Evens, MD, MPH [facility] Pager- [phone] 08/10/2016 10:44 AM

## 2016-08-10 NOTE — Progress Notes (Signed)
On 07/20/16 Started maintenance alimta/keytruda. No carbo.  Treatment plan not changed.

## 2016-08-11 LAB — CEA: CEA1: 15308 ng/mL — AB (ref 0.0–4.7)

## 2016-08-13 ENCOUNTER — Telehealth: Payer: Self-pay | Admitting: *Deleted

## 2016-08-13 NOTE — Telephone Encounter (Signed)
I called the pt's daughter because the pt is hoarse all the time and can't speak loud enough on the phone to have conversation with. I spoke to Austin Bryan and told her that his tumor markers were going down but then they have sent up to 15,000 and so therefore Austin Bryan would like to move scans up and then see md after scans.  Daughter is agreeable and would like scans Friday of this week if possible.  I called and got scan changed to 8/3 9 am for injection for bone scan, 10 am for ct scan, 12 noon for the scan after the injection.  Patient to be NPO for 4 hours for ct scan, pick up prep kit before scan and this will be done in hospital.  When I called daughter back I got her voicemail and left all info on her voicemail about the dates and times for scans. I also wanted her to call me about what date they want to come in . It could be 8/6 in [place] or 8/7 or 8/9 in [place]. Will await call back from daughter.

## 2016-08-17 ENCOUNTER — Encounter
Admission: RE | Admit: 2016-08-17 | Discharge: 2016-08-17 | Disposition: A | Discharge status: Home / Self Care | Payer: Medicare HMO | Source: Ambulatory Visit | Attending: Oncology | Admitting: Oncology

## 2016-08-17 ENCOUNTER — Inpatient Hospital Stay: Payer: Medicare HMO

## 2016-08-17 ENCOUNTER — Ambulatory Visit
Admission: RE | Admit: 2016-08-17 | Discharge: 2016-08-17 | Disposition: A | Discharge status: Home / Self Care | Payer: Medicare HMO | Source: Ambulatory Visit | Attending: Oncology | Admitting: Oncology

## 2016-08-17 ENCOUNTER — Encounter: Payer: Self-pay | Admitting: Nurse Practitioner

## 2016-08-17 ENCOUNTER — Other Ambulatory Visit: Payer: Self-pay | Admitting: *Deleted

## 2016-08-17 ENCOUNTER — Inpatient Hospital Stay: Payer: Medicare HMO | Attending: Oncology | Admitting: Oncology

## 2016-08-17 ENCOUNTER — Other Ambulatory Visit: Payer: Self-pay

## 2016-08-17 VITALS — BP 87/59 | HR 102 | Temp 98.0°F | Wt 149.1 lb

## 2016-08-17 DIAGNOSIS — C787 Secondary malignant neoplasm of liver and intrahepatic bile duct: Secondary | ICD-10-CM

## 2016-08-17 DIAGNOSIS — C3490 Malignant neoplasm of unspecified part of unspecified bronchus or lung: Secondary | ICD-10-CM

## 2016-08-17 DIAGNOSIS — C3411 Malignant neoplasm of upper lobe, right bronchus or lung: Secondary | ICD-10-CM | POA: Insufficient documentation

## 2016-08-17 DIAGNOSIS — E119 Type 2 diabetes mellitus without complications: Secondary | ICD-10-CM | POA: Diagnosis not present

## 2016-08-17 DIAGNOSIS — R970 Elevated carcinoembryonic antigen [CEA]: Secondary | ICD-10-CM | POA: Insufficient documentation

## 2016-08-17 DIAGNOSIS — C7951 Secondary malignant neoplasm of bone: Secondary | ICD-10-CM | POA: Insufficient documentation

## 2016-08-17 DIAGNOSIS — D696 Thrombocytopenia, unspecified: Secondary | ICD-10-CM | POA: Insufficient documentation

## 2016-08-17 DIAGNOSIS — R112 Nausea with vomiting, unspecified: Secondary | ICD-10-CM

## 2016-08-17 DIAGNOSIS — J439 Emphysema, unspecified: Secondary | ICD-10-CM | POA: Diagnosis not present

## 2016-08-17 DIAGNOSIS — J3801 Paralysis of vocal cords and larynx, unilateral: Secondary | ICD-10-CM | POA: Diagnosis not present

## 2016-08-17 DIAGNOSIS — R599 Enlarged lymph nodes, unspecified: Secondary | ICD-10-CM | POA: Insufficient documentation

## 2016-08-17 DIAGNOSIS — I712 Thoracic aortic aneurysm, without rupture: Secondary | ICD-10-CM | POA: Insufficient documentation

## 2016-08-17 DIAGNOSIS — I700 Atherosclerosis of aorta: Secondary | ICD-10-CM | POA: Insufficient documentation

## 2016-08-17 DIAGNOSIS — N289 Disorder of kidney and ureter, unspecified: Secondary | ICD-10-CM | POA: Insufficient documentation

## 2016-08-17 DIAGNOSIS — R948 Abnormal results of function studies of other organs and systems: Secondary | ICD-10-CM

## 2016-08-17 DIAGNOSIS — D6959 Other secondary thrombocytopenia: Secondary | ICD-10-CM | POA: Diagnosis not present

## 2016-08-17 DIAGNOSIS — Z95828 Presence of other vascular implants and grafts: Secondary | ICD-10-CM

## 2016-08-17 DIAGNOSIS — I2510 Atherosclerotic heart disease of native coronary artery without angina pectoris: Secondary | ICD-10-CM | POA: Insufficient documentation

## 2016-08-17 DIAGNOSIS — R7989 Other specified abnormal findings of blood chemistry: Secondary | ICD-10-CM

## 2016-08-17 DIAGNOSIS — Z79899 Other long term (current) drug therapy: Secondary | ICD-10-CM | POA: Insufficient documentation

## 2016-08-17 DIAGNOSIS — D7389 Other diseases of spleen: Secondary | ICD-10-CM | POA: Diagnosis not present

## 2016-08-17 DIAGNOSIS — C778 Secondary and unspecified malignant neoplasm of lymph nodes of multiple regions: Secondary | ICD-10-CM | POA: Insufficient documentation

## 2016-08-17 DIAGNOSIS — R945 Abnormal results of liver function studies: Secondary | ICD-10-CM

## 2016-08-17 DIAGNOSIS — J849 Interstitial pulmonary disease, unspecified: Secondary | ICD-10-CM | POA: Insufficient documentation

## 2016-08-17 DIAGNOSIS — R918 Other nonspecific abnormal finding of lung field: Secondary | ICD-10-CM

## 2016-08-17 DIAGNOSIS — Z7984 Long term (current) use of oral hypoglycemic drugs: Secondary | ICD-10-CM | POA: Insufficient documentation

## 2016-08-17 DIAGNOSIS — Z9221 Personal history of antineoplastic chemotherapy: Secondary | ICD-10-CM | POA: Diagnosis not present

## 2016-08-17 DIAGNOSIS — R911 Solitary pulmonary nodule: Secondary | ICD-10-CM | POA: Insufficient documentation

## 2016-08-17 DIAGNOSIS — Z7189 Other specified counseling: Secondary | ICD-10-CM

## 2016-08-17 DIAGNOSIS — R630 Anorexia: Secondary | ICD-10-CM | POA: Diagnosis not present

## 2016-08-17 DIAGNOSIS — R634 Abnormal weight loss: Secondary | ICD-10-CM | POA: Insufficient documentation

## 2016-08-17 DIAGNOSIS — F17210 Nicotine dependence, cigarettes, uncomplicated: Secondary | ICD-10-CM | POA: Diagnosis not present

## 2016-08-17 DIAGNOSIS — K8020 Calculus of gallbladder without cholecystitis without obstruction: Secondary | ICD-10-CM | POA: Diagnosis not present

## 2016-08-17 DIAGNOSIS — Z66 Do not resuscitate: Secondary | ICD-10-CM | POA: Insufficient documentation

## 2016-08-17 LAB — COMPREHENSIVE METABOLIC PANEL
ALBUMIN: 3.6 g/dL (ref 3.5–5.0)
ALK PHOS: 145 U/L — AB (ref 38–126)
ALT: 109 U/L — ABNORMAL HIGH (ref 17–63)
ANION GAP: 10 (ref 5–15)
AST: 143 U/L — AB (ref 15–41)
BILIRUBIN TOTAL: 1.8 mg/dL — AB (ref 0.3–1.2)
BUN: 15 mg/dL (ref 6–20)
CALCIUM: 8 mg/dL — AB (ref 8.9–10.3)
CO2: 27 mmol/L (ref 22–32)
Chloride: 98 mmol/L — ABNORMAL LOW (ref 101–111)
Creatinine, Ser: 0.66 mg/dL (ref 0.61–1.24)
GFR calc Af Amer: >60 mL/min (ref >60)
GFR calc non Af Amer: >60 mL/min (ref >60)
GLUCOSE: 98 mg/dL (ref 65–99)
Potassium: 4.2 mmol/L (ref 3.5–5.1)
SODIUM: 135 mmol/L (ref 135–145)
TOTAL PROTEIN: 7 g/dL (ref 6.5–8.1)

## 2016-08-17 LAB — CBC WITH DIFFERENTIAL/PLATELET
BASOS ABS: 0 10*3/uL (ref 0–0.1)
BASOS PCT: 1 %
EOS ABS: 0.1 10*3/uL (ref 0–0.7)
Eosinophils Relative: 1 %
HEMATOCRIT: 36 % — AB (ref 40.0–52.0)
HEMOGLOBIN: 12.4 g/dL — AB (ref 13.0–18.0)
Lymphocytes Relative: 35 %
Lymphs Abs: 1.7 10*3/uL (ref 1.0–3.6)
MCH: 35.5 pg — ABNORMAL HIGH (ref 26.0–34.0)
MCHC: 34.5 g/dL (ref 32.0–36.0)
MCV: 103 fL — ABNORMAL HIGH (ref 80.0–100.0)
Monocytes Absolute: 0.2 10*3/uL (ref 0.2–1.0)
Monocytes Relative: 4 %
NEUTROS ABS: 2.9 10*3/uL (ref 1.4–6.5)
NEUTROS PCT: 59 %
Platelets: 103 10*3/uL — ABNORMAL LOW (ref 150–440)
RBC: 3.5 MIL/uL — AB (ref 4.40–5.90)
RDW: 16.9 % — ABNORMAL HIGH (ref 11.5–14.5)
WBC: 4.9 10*3/uL (ref 3.8–10.6)

## 2016-08-17 MED ORDER — TECHNETIUM TC 99M MEDRONATE IV KIT
25.0000 | PACK | Freq: Once | INTRAVENOUS | Status: AC | PRN
  Administered 2016-08-17: 22.33 via INTRAVENOUS

## 2016-08-17 MED ORDER — SODIUM CHLORIDE 0.9 % IV SOLN
Freq: Once | INTRAVENOUS | Status: AC
  Administered 2016-08-17: 15:00:00 via INTRAVENOUS
  Filled 2016-08-17: qty 1000

## 2016-08-17 MED ORDER — HEPARIN SOD (PORK) LOCK FLUSH 100 UNIT/ML IV SOLN
500.0000 [IU] | Freq: Once | INTRAVENOUS | Status: AC
Start: 2016-08-17 — End: 2016-08-17
  Administered 2016-08-17: 500 [IU] via INTRAVENOUS

## 2016-08-17 MED ORDER — IOPAMIDOL (ISOVUE-300) INJECTION 61%
100.0000 mL | Freq: Once | INTRAVENOUS | Status: AC | PRN
  Administered 2016-08-17: 100 mL via INTRAVENOUS

## 2016-08-17 MED ORDER — PROCHLORPERAZINE MALEATE 10 MG PO TABS: 10.0000 mg | ORAL_TABLET | Freq: Four times a day (QID) | ORAL | 1 refills | Status: DC | PRN

## 2016-08-17 MED ORDER — PROCHLORPERAZINE EDISYLATE 5 MG/ML IJ SOLN
10.0000 mg | Freq: Once | INTRAMUSCULAR | Status: AC
  Administered 2016-08-17: 10 mg via INTRAVENOUS
  Filled 2016-08-17: qty 2

## 2016-08-17 NOTE — Progress Notes (Signed)
   Hematology/Oncology Consult note [facility]  [phone] [phone]  Patient Care Team: Headrick, Emily, FNP as PCP - General (Family Medicine)   Name of the patient: Austin Bryan  [id number]  07/16/1950   Date of visit: 08/17/16  Diagnosis- Stage Iv adenocarcinoma of the lung  Chief complaint/ Reason for visit- sick visit for nausea/vomiting  Heme/Onc history: 1. Patient is a 65-year-old male who was seen by Dr. walk from ENT for symptoms of hoarseness of voice for 1 month. 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 Patient is a chronic smoker and has smoked about 1-1/2 packs per day for more than 30 years. He has lost about 17 pounds of weight over the last 6-7 months. Patient lives with his sister and is independent of hisADLs and IADLs.  2. CT soft tissue of the neck on 04/02/2016 showed: 1. Soft tissue mass along the undersurface of the aortic arch is most concerning for a metastatic adenopathy. This likely impacts the left recurrent laryngeal nerve leading to vocal cord paralysis. 2. Extensive mediastinal adenopathy compatible with metastatic disease. 3. Numerous lytic lesions throughout the visualized is cervical and thoracic spine as well is bilateral ribs. These are compatible with additional metastases. 4. 12 mm pleural-based nodule in the left upper lid may represent a primary bronchogenic neoplasm. This is more likely metastatic disease. 5. Findings are most concerning for a primary lung cancer. Recommend CT of the chest, abdomen, and pelvis with contrast for further evaluation of the primary neoplasm and extent of metastatic disease.  3. PET CT scan on 04/10/2016 showed:IMPRESSION:1. Extensive lytic malignancy in the skeleton with heavy burden of hepatic metastatic lesions, adenopathy in the chest, lower neck, and likely porta hepatis compatible with malignancy ; and a small left upper lobe pulmonary nodule which is  likewise hypermetabolic. The bony disease resembles myeloma but it would be unusual to have this much hepatic and soft tissue involvement in the setting of myeloma. Other possibilities might include an unusually aggressive central lung cancer. Tissue diagnosis recommended. 2. AP window mass is indistinct and may be interfering with the recurrent laryngeal nerve. The asymmetric appearance of the cords is probably secondary to this interference. 3. 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 Cholelithiasis.  4. US guided liver biopsy showed: DIAGNOSIS:  A. LIVER MASS, LEFT LOBE; ULTRASOUND GUIDED BIOPSY:  - METASTATIC NON-SMALL CELL CARCINOMA, TTF-1 POSITIVE.   Comment:  A limited panel of immunohistochemical stains was performed. The  metastatic malignant neoplasm demonstrates the following pattern of  immunoreactivity:  Super pancytokeratin: Positive  TTF-1: Positive  CD56: Negative  CDX-2: Negative  Stain controls worked appropriately. The morphologic findings are  consistent with metastatic poorly differentiated carcinoma. The presence  of TTF-1 positivity is compatible with lung origin.   5. Cycle 1 carbo/alimta given on 04/26/16. He gets monthly X geva. CEA elevated at 7641 at baseline. keytruda added to cycle 2  6. Foundation one testing showed no EGFR, ALK, ROS and BRAF mutations. PDL1 expression was 70%. No other actionable mutations  7. Interim scans after 4 cycles of chemo/immunotherapy showed: . Mixed response to therapy. Specifically, although there has been slight regression of previously noted left upper lobe pulmonary nodule, extensive mediastinal and left hilar lymphadenopathy appears slightly progressive and there is been enlargement over right middle lobe pulmonary nodule. Previously noted widespread metastatic disease to the liver and  bones   appear similar to the prior examination,   Interval history- Patient had his CT scan today. He reports not doing well for last 3 days. He has been feeling more nauseous with poor oral intake.no diarrhea or abdominal pain. He has had 2-3 episodes of vomiting  ECOG PS- 2 Pain scale- 0 Opioid associated constipation- no  Review of systems- Review of Systems  Constitutional: Positive for malaise/fatigue. Negative for chills, fever and weight loss.  HENT: Negative for congestion, ear discharge and nosebleeds.   Eyes: Negative for blurred vision.  Respiratory: Negative for cough, hemoptysis, sputum production, shortness of breath and wheezing.   Cardiovascular: Negative for chest pain, palpitations, orthopnea and claudication.  Gastrointestinal: Positive for nausea and vomiting. Negative for abdominal pain, blood in stool, constipation, diarrhea, heartburn and melena.  Genitourinary: Negative for dysuria, flank pain, frequency, hematuria and urgency.  Musculoskeletal: Negative for back pain, joint pain and myalgias.  Skin: Negative for rash.  Neurological: Negative for dizziness, tingling, focal weakness, seizures, weakness and headaches.  Endo/Heme/Allergies: Does not bruise/bleed easily.  Psychiatric/Behavioral: Negative for depression and suicidal ideas. The patient does not have insomnia.       No Known Allergies   Past Medical History:  Diagnosis Date  . Allergy   . Diabetes mellitus without complication (HCC)   . Dyspnea   . Lung cancer (HCC) 03/2016   Chemo tx's.     Past Surgical History:  Procedure Laterality Date  . ADENOIDECTOMY    . COLONOSCOPY    . IR FLUORO GUIDE PORT INSERTION RIGHT  04/16/2016  . TONSILLECTOMY      Social History   Social History  . Marital status: Divorced    Spouse name: N/A  . Number of children: N/A  . Years of education: N/A   Occupational History  . Not on file.   Social History Main Topics  . Smoking status: Current Every  Day Smoker    Packs/day: 1.00    Years: 40.00    Types: Cigarettes  . Smokeless tobacco: Never Used  . Alcohol use No  . Drug use: No  . Sexual activity: Not on file   Other Topics Concern  . Not on file   Social History Narrative  . No narrative on file    Family History  Problem Relation Age of Onset  . Cancer Mother   . Cancer Father      Current Outpatient Prescriptions:  .  chlorproMAZINE (THORAZINE) 25 MG tablet, Take 1 tablet (25 mg total) by mouth every 6 (six) hours as needed. (Patient not taking: Reported on 06/29/2016), Disp: 30 tablet, Rfl: 0 .  dexamethasone (DECADRON) 4 MG tablet, Take 1 tab two times a day the day before Alimta chemo. Take 2 tabs two times a day starting the day after chemo for 3 days., Disp: 30 tablet, Rfl: 1 .  fexofenadine (ALLEGRA) 180 MG tablet, Take 1 tablet (180 mg total) by mouth daily., Disp: 90 tablet, Rfl: 2 .  fluticasone (FLONASE) 50 MCG/ACT nasal spray, Place 2 sprays into both nostrils daily., Disp: , Rfl:  .  folic acid (FOLVITE) 1 MG tablet, Take 1 tablet (1 mg total) by mouth daily. Start 5-7 days before Alimta chemotherapy. Continue until 21 days after Alimta completed., Disp: 100 tablet, Rfl: 3 .  lidocaine (LIDODERM) 5 %, Place 1 patch onto the skin daily. Remove & Discard patch within 12 hours or as directed by MD, Disp: 30 patch, Rfl: 0 .  lidocaine-prilocaine (EMLA) cream, Apply to affected   area once, Disp: 30 g, Rfl: 3 .  metFORMIN (GLUCOPHAGE) 500 MG tablet, Take 1 tablet (500 mg total) by mouth 2 (two) times daily with a meal., Disp: 180 tablet, Rfl: 2 .  morphine (MS CONTIN) 15 MG 12 hr tablet, Take 1 tablet (15 mg total) by mouth every 12 (twelve) hours., Disp: 60 tablet, Rfl: 0 .  Morphine Sulfate (MORPHINE CONCENTRATE) 10 mg / 0.5 ml concentrated solution, Take by mouth every 4 (four) hours as needed for severe pain. 0.25 ml every 4 hours as needed (prescribed by Dr. Ann Brown), Disp: , Rfl:  .  ondansetron (ZOFRAN) 8 MG  tablet, Take 1 tablet (8 mg total) by mouth 2 (two) times daily as needed for refractory nausea / vomiting. Start on day 3 after chemo., Disp: 30 tablet, Rfl: 1 .  prochlorperazine (COMPAZINE) 10 MG tablet, Take 1 tablet (10 mg total) by mouth every 6 (six) hours as needed (Nausea or vomiting)., Disp: 30 tablet, Rfl: 1 .  ranitidine (ZANTAC) 150 MG tablet, Take 1 tablet (150 mg total) by mouth 2 (two) times daily., Disp: 180 tablet, Rfl: 2 .  senna (SENOKOT) 8.6 MG TABS tablet, Take 2 tablets (17.2 mg total) by mouth at bedtime., Disp: 120 each, Rfl: 0  Physical exam:  Vitals:   08/17/16 1349  BP: (!) 87/59  Pulse: (!) 102  Temp: 98 F (36.7 C)  TempSrc: Tympanic  Weight: 149 lb 2 oz (67.6 kg)   Physical Exam  Constitutional: He is oriented to person, place, and time.  Patient appears in mild distress due to nausea. fatigued  HENT:  Head: Normocephalic and atraumatic.  Eyes: Pupils are equal, round, and reactive to light. EOM are normal.  Neck: Normal range of motion.  Cardiovascular: Regular rhythm and normal heart sounds.   tachycardic  Pulmonary/Chest: Effort normal and breath sounds normal.  Abdominal: Soft. Bowel sounds are normal.  Neurological: He is alert and oriented to person, place, and time.  Skin: Skin is warm and dry.     CMP Latest Ref Rng & Units 08/17/2016  Glucose 65 - 99 mg/dL 98  BUN 6 - 20 mg/dL 15  Creatinine 0.61 - 1.24 mg/dL 0.66  Sodium 135 - 145 mmol/L 135  Potassium 3.5 - 5.1 mmol/L 4.2  Chloride 101 - 111 mmol/L 98(L)  CO2 22 - 32 mmol/L 27  Calcium 8.9 - 10.3 mg/dL 8.0(L)  Total Protein 6.5 - 8.1 g/dL 7.0  Total Bilirubin 0.3 - 1.2 mg/dL 1.8(H)  Alkaline Phos 38 - 126 U/L 145(H)  AST 15 - 41 U/L 143(H)  ALT 17 - 63 U/L 109(H)   CBC Latest Ref Rng & Units 08/17/2016  WBC 3.8 - 10.6 K/uL 4.9  Hemoglobin 13.0 - 18.0 g/dL 12.4(L)  Hematocrit 40.0 - 52.0 % 36.0(L)  Platelets 150 - 440 K/uL 103(L)     Assessment and plan- Patient is a 65 y.o.  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 Discussed results ct thorax and abdomen with the patient. There are new lesions noted in spleen and kidney. Bone and liver lesions appear stable. Adenopathy was appearing slightly worse. Also his CEA has increased exponentially. Overall I think he has progressed and would need to be switched to second line therapy. His performance status is slowly declining. Discussed potential second line chemotherapy at [facility] versus second opinion with Dr. Stinchcomb to see if he would be a candidate for any clinical trial. His liver   functions are worse and that might might preclude him from any clinical trial  2. Abnormal LFT's- now new medications. Reviewed current medication list. He is not currently taking thorazine. AST, ALT significantly worse and elevated bilirubin is elevated too. 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 No biliary ductal dilataion seen. No findings of acute cholecystitis seen on CT. I will repeat cmp next week. If it is worse, I will consider starting steroids after ruling out hepatitis. Will consider referral to GI at that time. Keytruda will be on hold at this time  3. Nasuea/vomiting- could be due to abnormal LFT's. I gave him the option of going to the ER and getting admitted. He would like to try outpatient measures first. Will give 1L NS and IV prn nauseamedications. encourgae PO intake at home. Electrolytes and kidney functions are normal. He knows to call us if he feels worse prior to next visit  4. Patient has widely metastatic stage IV lung cancer. His overall prognosis is poor in the light of declining performance status and worsening LFT's and symptoms. I discussed CODE status and treatment preferences with him. I have given him a copy of MOST form to think about his options and I will again discuss this at next visit   5. Mild thrombocytopenia. Continue to monitor  Visit  Diagnosis 1. Primary malignant neoplasm of lung metastatic to other site, unspecified laterality (HCC)   2. Bone metastases (HCC)   3. Abnormal LFTs   4. Nausea and vomiting, intractability of vomiting not specified, unspecified vomiting type   5. Goals of care, counseling/discussion      Dr. Archana Rao, MD, MPH [facility] Pager- [phone] 08/17/2016                 

## 2016-08-17 NOTE — Progress Notes (Signed)
Patient acute add on today for nausea and vomiting.

## 2016-08-20 ENCOUNTER — Inpatient Hospital Stay: Payer: Medicare HMO | Admitting: Oncology

## 2016-08-23 ENCOUNTER — Inpatient Hospital Stay (HOSPITAL_BASED_OUTPATIENT_CLINIC_OR_DEPARTMENT_OTHER): Payer: Medicare HMO | Admitting: Oncology

## 2016-08-23 ENCOUNTER — Inpatient Hospital Stay: Payer: Medicare HMO

## 2016-08-23 VITALS — BP 105/75 | HR 114 | Temp 97.6°F | Wt 148.2 lb

## 2016-08-23 DIAGNOSIS — I2510 Atherosclerotic heart disease of native coronary artery without angina pectoris: Secondary | ICD-10-CM

## 2016-08-23 DIAGNOSIS — C7951 Secondary malignant neoplasm of bone: Secondary | ICD-10-CM

## 2016-08-23 DIAGNOSIS — C3490 Malignant neoplasm of unspecified part of unspecified bronchus or lung: Secondary | ICD-10-CM

## 2016-08-23 DIAGNOSIS — R634 Abnormal weight loss: Secondary | ICD-10-CM

## 2016-08-23 DIAGNOSIS — C778 Secondary and unspecified malignant neoplasm of lymph nodes of multiple regions: Secondary | ICD-10-CM

## 2016-08-23 DIAGNOSIS — Z79899 Other long term (current) drug therapy: Secondary | ICD-10-CM | POA: Diagnosis not present

## 2016-08-23 DIAGNOSIS — I712 Thoracic aortic aneurysm, without rupture: Secondary | ICD-10-CM

## 2016-08-23 DIAGNOSIS — R630 Anorexia: Secondary | ICD-10-CM

## 2016-08-23 DIAGNOSIS — E119 Type 2 diabetes mellitus without complications: Secondary | ICD-10-CM

## 2016-08-23 DIAGNOSIS — R112 Nausea with vomiting, unspecified: Secondary | ICD-10-CM

## 2016-08-23 DIAGNOSIS — I700 Atherosclerosis of aorta: Secondary | ICD-10-CM

## 2016-08-23 DIAGNOSIS — J439 Emphysema, unspecified: Secondary | ICD-10-CM

## 2016-08-23 DIAGNOSIS — R599 Enlarged lymph nodes, unspecified: Secondary | ICD-10-CM

## 2016-08-23 DIAGNOSIS — F17210 Nicotine dependence, cigarettes, uncomplicated: Secondary | ICD-10-CM | POA: Diagnosis not present

## 2016-08-23 DIAGNOSIS — C3411 Malignant neoplasm of upper lobe, right bronchus or lung: Secondary | ICD-10-CM

## 2016-08-23 DIAGNOSIS — D696 Thrombocytopenia, unspecified: Secondary | ICD-10-CM

## 2016-08-23 DIAGNOSIS — R948 Abnormal results of function studies of other organs and systems: Secondary | ICD-10-CM

## 2016-08-23 DIAGNOSIS — N289 Disorder of kidney and ureter, unspecified: Secondary | ICD-10-CM

## 2016-08-23 DIAGNOSIS — Z9221 Personal history of antineoplastic chemotherapy: Secondary | ICD-10-CM

## 2016-08-23 DIAGNOSIS — J3801 Paralysis of vocal cords and larynx, unilateral: Secondary | ICD-10-CM

## 2016-08-23 DIAGNOSIS — D7389 Other diseases of spleen: Secondary | ICD-10-CM

## 2016-08-23 DIAGNOSIS — K8020 Calculus of gallbladder without cholecystitis without obstruction: Secondary | ICD-10-CM

## 2016-08-23 DIAGNOSIS — Z7984 Long term (current) use of oral hypoglycemic drugs: Secondary | ICD-10-CM

## 2016-08-23 DIAGNOSIS — D6959 Other secondary thrombocytopenia: Secondary | ICD-10-CM

## 2016-08-23 DIAGNOSIS — C787 Secondary malignant neoplasm of liver and intrahepatic bile duct: Secondary | ICD-10-CM

## 2016-08-23 LAB — COMPREHENSIVE METABOLIC PANEL
ALBUMIN: 3 g/dL — AB (ref 3.5–5.0)
ALK PHOS: 160 U/L — AB (ref 38–126)
ALT: 110 U/L — ABNORMAL HIGH (ref 17–63)
ANION GAP: 6 (ref 5–15)
AST: 92 U/L — ABNORMAL HIGH (ref 15–41)
BUN: 13 mg/dL (ref 6–20)
CALCIUM: 9.6 mg/dL (ref 8.9–10.3)
CO2: 30 mmol/L (ref 22–32)
Chloride: 100 mmol/L — ABNORMAL LOW (ref 101–111)
Creatinine, Ser: 0.75 mg/dL (ref 0.61–1.24)
GFR calc non Af Amer: >60 mL/min (ref >60)
GLUCOSE: 95 mg/dL (ref 65–99)
POTASSIUM: 4.4 mmol/L (ref 3.5–5.1)
SODIUM: 136 mmol/L (ref 135–145)
TOTAL PROTEIN: 6.1 g/dL — AB (ref 6.5–8.1)
Total Bilirubin: 0.5 mg/dL (ref 0.3–1.2)

## 2016-08-23 LAB — CBC WITH DIFFERENTIAL/PLATELET
BASOS ABS: 0 10*3/uL (ref 0–0.1)
BASOS PCT: 0 %
Eosinophils Absolute: 0.1 10*3/uL (ref 0–0.7)
Eosinophils Relative: 3 %
HEMATOCRIT: 31.2 % — AB (ref 40.0–52.0)
Hemoglobin: 10.6 g/dL — ABNORMAL LOW (ref 13.0–18.0)
LYMPHS PCT: 25 %
Lymphs Abs: 1.3 10*3/uL (ref 1.0–3.6)
MCH: 35.5 pg — ABNORMAL HIGH (ref 26.0–34.0)
MCHC: 33.8 g/dL (ref 32.0–36.0)
MCV: 105.1 fL — ABNORMAL HIGH (ref 80.0–100.0)
MONO ABS: 1.8 10*3/uL — AB (ref 0.2–1.0)
Monocytes Relative: 35 %
NEUTROS ABS: 1.9 10*3/uL (ref 1.4–6.5)
Neutrophils Relative %: 37 %
PLATELETS: 45 10*3/uL — AB (ref 150–440)
RBC: 2.97 MIL/uL — AB (ref 4.40–5.90)
RDW: 17.5 % — AB (ref 11.5–14.5)
WBC: 5.1 10*3/uL (ref 3.8–10.6)

## 2016-08-23 MED ORDER — OLANZAPINE 10 MG PO TABS: 10.0000 mg | ORAL_TABLET | Freq: Every day | ORAL | 2 refills | Status: DC

## 2016-08-23 NOTE — Progress Notes (Signed)
Patient is here today for a follow up. Patient states on new concerns today.

## 2016-08-24 ENCOUNTER — Encounter: Payer: Self-pay | Admitting: Oncology

## 2016-08-24 LAB — CEA: CEA: 14846 ng/mL — ABNORMAL HIGH (ref 0.0–4.7)

## 2016-08-24 NOTE — Progress Notes (Signed)
Hematology/Oncology Consult note [facility]  [phone] [phone]  Patient Care Team: Gennette Pac, Binger as PCP - General (Family Medicine)   Name of the patient: Austin Bryan  [id number]  11-13-1950   Date of visit: 08/24/16  Diagnosis- Stage IV adenocarcinoma of the lung  Chief complaint/ Reason for visit- sick visit for nausea/vomiting  Heme/Onc history: 1. Patient is a 66 year old male who was seen by Dr. walk from ENT for symptoms of hoarseness of voice for 1 month. 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 Patient is a chronic smoker and has smoked about 1-1/2 packs per day for more than 30 years. He has lost about 17 pounds of weight over the last 6-7 months. Patient lives with his sister and is independent of hisADLs and IADLs.  2. CT soft tissue of the neck on 04/02/2016 showed: 1. Soft tissue mass along the undersurface of the aortic arch is most concerning for a metastatic adenopathy. This likely impacts the left recurrent laryngeal nerve leading to vocal cord paralysis. 2. Extensive mediastinal adenopathy compatible with metastatic disease. 3. Numerous lytic lesions throughout the visualized is cervical and thoracic spine as well is bilateral ribs. These are compatible with additional metastases. 4. 12 mm pleural-based nodule in the left upper lid may represent a primary bronchogenic neoplasm. This is more likely metastatic disease. 5. Findings are most concerning for a primary lung cancer. Recommend CT of the chest, abdomen, and pelvis with contrast for further evaluation of the primary neoplasm and extent of metastatic disease.  3. PET CT scan on 04/10/2016 showed:IMPRESSION:1. Extensive lytic malignancy in the skeleton with heavy burden of hepatic metastatic lesions, adenopathy in the chest, lower neck, and likely porta hepatis compatible with malignancy ; and a small left upper lobe pulmonary nodule which is  likewise hypermetabolic. The bony disease resembles myeloma but it would be unusual to have this much hepatic and soft tissue involvement in the setting of myeloma. Other possibilities might include an unusually aggressive central lung cancer. Tissue diagnosis recommended. 2. AP window mass is indistinct and may be interfering with the recurrent laryngeal nerve. The asymmetric appearance of the cords is probably secondary to this interference. 3. 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 Cholelithiasis.  4. US guided liver biopsy showed: DIAGNOSIS:  A. LIVER MASS, LEFT LOBE; ULTRASOUND GUIDED BIOPSY:  - METASTATIC NON-SMALL CELL CARCINOMA, TTF-1 POSITIVE.   Comment:  A limited panel of immunohistochemical stains was performed. The  metastatic malignant neoplasm demonstrates the following pattern of  immunoreactivity:  Super pancytokeratin: Positive  TTF-1: Positive  CD56: Negative  CDX-2: Negative  Stain controls worked appropriately. The morphologic findings are  consistent with metastatic poorly differentiated carcinoma. The presence  of TTF-1 positivity is compatible with lung origin.   5. Cycle 1 carbo/alimta given on 04/26/16. He gets monthly X geva. CEA elevated at 7641 at baseline. keytruda added to cycle 2  6. Foundation one testing showed no EGFR, ALK, ROS and BRAF mutations. PDL1 expression was 70%. No other actionable mutations  7. Interim scans after 4 cycles of chemo/immunotherapy showed: Marland Kitchen Mixed response to therapy. Specifically, although there has been slight regression of previously noted left upper lobe pulmonary nodule, extensive mediastinal and left hilar lymphadenopathy appears slightly progressive and there is been enlargement over right middle lobe pulmonary nodule. Previously noted widespread metastatic disease to the liver and  bones  appear similar to the prior Examination   Interval history- he is eating better. Nausea is better. He is needing about 3-4 doses of compazine daily  ECOG PS- 2 Pain scale- 0 Opioid associated constipation- no  Review of systems- Review of Systems  Constitutional: Positive for malaise/fatigue. Negative for chills, fever and weight loss.  HENT: Negative for congestion, ear discharge and nosebleeds.   Eyes: Negative for blurred vision.  Respiratory: Negative for cough, hemoptysis, sputum production, shortness of breath and wheezing.   Cardiovascular: Negative for chest pain, palpitations, orthopnea and claudication.  Gastrointestinal: Positive for nausea. Negative for abdominal pain, blood in stool, constipation, diarrhea, heartburn, melena and vomiting.  Genitourinary: Negative for dysuria, flank pain, frequency, hematuria and urgency.  Musculoskeletal: Negative for back pain, joint pain and myalgias.  Skin: Negative for rash.  Neurological: Negative for dizziness, tingling, focal weakness, seizures, weakness and headaches.  Endo/Heme/Allergies: Does not bruise/bleed easily.  Psychiatric/Behavioral: Negative for depression and suicidal ideas. The patient does not have insomnia.      No Known Allergies   Past Medical History:  Diagnosis Date  . Allergy   . Diabetes mellitus without complication ([place])   . Dyspnea   . Lung cancer ([place]) 03/2016   Chemo tx's.     Past Surgical History:  Procedure Laterality Date  . ADENOIDECTOMY    . COLONOSCOPY    . IR FLUORO GUIDE PORT INSERTION RIGHT  04/16/2016  . TONSILLECTOMY      Social History   Social History  . Marital status: Divorced    Spouse name: N/A  . Number of children: N/A  . Years of education: N/A   Occupational History  . Not on file.   Social History Main Topics  . Smoking status: Current Every Day Smoker    Packs/day: 1.00    Years: 40.00    Types: Cigarettes  . Smokeless tobacco: Never Used  .  Alcohol use No  . Drug use: No  . Sexual activity: Not on file   Other Topics Concern  . Not on file   Social History Narrative  . No narrative on file    Family History  Problem Relation Age of Onset  . Cancer Mother   . Cancer Father      Current Outpatient Prescriptions:  .  chlorproMAZINE (THORAZINE) 25 MG tablet, Take 1 tablet (25 mg total) by mouth every 6 (six) hours as needed., Disp: 30 tablet, Rfl: 0 .  dexamethasone (DECADRON) 4 MG tablet, Take 1 tab two times a day the day before Alimta chemo. Take 2 tabs two times a day starting the day after chemo for 3 days., Disp: 30 tablet, Rfl: 1 .  fexofenadine (ALLEGRA) 180 MG tablet, Take 1 tablet (180 mg total) by mouth daily., Disp: 90 tablet, Rfl: 2 .  fluticasone (FLONASE) 50 MCG/ACT nasal spray, Place 2 sprays into both nostrils daily., Disp: , Rfl:  .  lidocaine-prilocaine (EMLA) cream, Apply to affected area once, Disp: 30 g, Rfl: 3 .  metFORMIN (GLUCOPHAGE) 500 MG tablet, Take 1 tablet (500 mg total) by mouth 2 (two) times daily with a meal., Disp: 180 tablet, Rfl: 2 .  ondansetron (ZOFRAN) 8 MG tablet, Take 1 tablet (8 mg total) by mouth 2 (two) times daily as needed for refractory nausea / vomiting. Start on day 3 after chemo., Disp: 30 tablet, Rfl: 1 .  prochlorperazine (COMPAZINE) 10 MG tablet, Take 1 tablet (10 mg total) by mouth every 6 (six) hours as needed (Nausea  or vomiting)., Disp: 30 tablet, Rfl: 1 .  ranitidine (ZANTAC) 150 MG tablet, Take 1 tablet (150 mg total) by mouth 2 (two) times daily., Disp: 180 tablet, Rfl: 2 .  senna (SENOKOT) 8.6 MG TABS tablet, Take 2 tablets (17.2 mg total) by mouth at bedtime., Disp: 120 each, Rfl: 0 .  folic acid (FOLVITE) 1 MG tablet, Take 1 tablet (1 mg total) by mouth daily. Start 5-7 days before Alimta chemotherapy. Continue until 21 days after Alimta completed. (Patient not taking: Reported on 08/23/2016), Disp: 100 tablet, Rfl: 3 .  lidocaine (LIDODERM) 5 %, Place 1 patch onto  the skin daily. Remove & Discard patch within 12 hours or as directed by MD (Patient not taking: Reported on 08/23/2016), Disp: 30 patch, Rfl: 0 .  morphine (MS CONTIN) 15 MG 12 hr tablet, Take 1 tablet (15 mg total) by mouth every 12 (twelve) hours. (Patient not taking: Reported on 08/23/2016), Disp: 60 tablet, Rfl: 0 .  Morphine Sulfate (MORPHINE CONCENTRATE) 10 mg / 0.5 ml concentrated solution, Take by mouth every 4 (four) hours as needed for severe pain. 0.25 ml every 4 hours as needed (prescribed by Dr. Lew Dawes), Disp: , Rfl:  .  OLANZapine (ZYPREXA) 10 MG tablet, Take 1 tablet (10 mg total) by mouth at bedtime., Disp: 30 tablet, Rfl: 2  Physical exam:  Vitals:   08/23/16 1543  BP: 105/75  Pulse: (!) 114  Temp: 97.6 F (36.4 C)  TempSrc: Tympanic  Weight: 148 lb 3.2 oz (67.2 kg)   Physical Exam  Constitutional: He is oriented to person, place, and time.  Fatigued. Appears in no acute distress  HENT:  Head: Normocephalic and atraumatic.  Eyes: Pupils are equal, round, and reactive to light. EOM are normal.  Neck: Normal range of motion.  Cardiovascular: Normal rate, regular rhythm and normal heart sounds.   Pulmonary/Chest: Effort normal and breath sounds normal.  Abdominal: Soft. Bowel sounds are normal.  Neurological: He is alert and oriented to person, place, and time.  Skin: Skin is warm and dry.     CMP Latest Ref Rng & Units 08/23/2016  Glucose 65 - 99 mg/dL 95  BUN 6 - 20 mg/dL 13  Creatinine 0.61 - 1.24 mg/dL 0.75  Sodium 135 - 145 mmol/L 136  Potassium 3.5 - 5.1 mmol/L 4.4  Chloride 101 - 111 mmol/L 100(L)  CO2 22 - 32 mmol/L 30  Calcium 8.9 - 10.3 mg/dL 9.6  Total Protein 6.5 - 8.1 g/dL 6.1(L)  Total Bilirubin 0.3 - 1.2 mg/dL 0.5  Alkaline Phos 38 - 126 U/L 160(H)  AST 15 - 41 U/L 92(H)  ALT 17 - 63 U/L 110(H)   CBC Latest Ref Rng & Units 08/23/2016  WBC 3.8 - 10.6 K/uL 5.1  Hemoglobin 13.0 - 18.0 g/dL 10.6(L)  Hematocrit 40.0 - 52.0 % 31.2(L)  Platelets 150 -  440 K/uL 45(L)    No images are attached to the encounter.  Ct Chest W Contrast  Result Date: 08/17/2016 CLINICAL DATA:  66 year old male with history of lung cancer status post chemotherapy. Recent history of nausea, vomiting, diarrhea, shortness of breath, chest pain and generalize abdominal pain for the past several weeks. EXAM: CT CHEST, ABDOMEN, AND PELVIS WITH CONTRAST TECHNIQUE: Multidetector CT imaging of the chest, abdomen and pelvis was performed following the standard protocol during bolus administration of intravenous contrast. CONTRAST:  139m ISOVUE-300 IOPAMIDOL (ISOVUE-300) INJECTION 61% COMPARISON:  CT the abdomen and pelvis 07/13/2016. PET-CT 04/10/2016. FINDINGS: CT CHEST FINDINGS  Cardiovascular: Heart size is normal. Small amount of anterior pericardial fluid and/or thickening, unlikely to be of hemodynamic significance at this time. No associated pericardial calcification. There is aortic atherosclerosis, as well as atherosclerosis of the great vessels of the mediastinum and the coronary arteries, including calcified atherosclerotic plaque in the left main, left anterior descending, left circumflex and right coronary arteries. Thickening and mild calcification of the aortic valve. Aneurysmal dilatation of ascending thoracic aorta (4.8 cm in diameter). Right internal jugular single-lumen porta cath with tip terminating at the superior cavoatrial junction. Mediastinum/Nodes: Multiple borderline enlarged and mildly enlarged mediastinal and bilateral hilar lymph nodes are noted, measuring up to 1.3 cm in the low left paratracheal nodal station, 1 cm in the right hilar nodal station and 1 cm in the subcarinal nodal station. Previously noted left hilar lymphadenopathy has slightly regressed. Esophagus is normal in appearance. No axillary lymphadenopathy. Lungs/Pleura: The previously noted nodule in the superior segment of the left lower lobe is stable in size measuring 11 x 8 mm (axial image 41  of series 4). Previously noted subpleural nodule in the posterior aspect of the right middle lobe abutting the major fissure (axial image 114 of series 4) is smaller than the prior study, measuring only 7 mm on today's examination. No acute consolidative airspace disease. No pleural effusions. Diffuse bronchial wall thickening with mild centrilobular and paraseptal emphysema. The patchy areas of extensive septal thickening with areas of mild peripheral cylindrical bronchiectasis and peripheral bronchiolectasis Musculoskeletal: Numerous predominantly lytic lesions are noted throughout the visualized axial and appendicular skeleton, compatible with widespread metastatic disease to the bones, including multiple rib lesions bilaterally, several of which demonstrate nondisplaced and minimally displaced pathologic fractures. CT ABDOMEN PELVIS FINDINGS Hepatobiliary: Again noted are innumerable hypovascular lesions scattered throughout the liver. When compared to the prior examination, direct comparison between individual lesions is challenging given the sheer number of lesions, however, the largest lesion seen on the prior study is are slightly larger than the prior examination measuring approximately 3.0 x 3.2 cm on today's study (axial image 64 of series 2) between segments 4A and 4B. No intra or extrahepatic biliary ductal dilatation. Innumerable tiny calcified gallstones lie dependently in the gallbladder. No findings to suggest an acute cholecystitis are noted at this time. Pancreas: No pancreatic mass. No pancreatic ductal dilatation. No pancreatic or peripancreatic fluid or inflammatory changes. Spleen: New hypovascular lesion measuring 2.8 cm in the superior aspect of the spleen, concerning for new metastatic lesion. Adrenals/Urinary Tract: New 1.4 cm intermediate attenuation lesion in the upper pole of the left kidney suspicious for a metastatic lesion (axial image 66 of series 2). Tiny subcentimeter  low-attenuation lesions in the right kidney are too small to definitively characterize, but are similar to the prior study, likely to represent tiny cysts. No hydroureteronephrosis. Urinary bladder is normal in appearance. Stomach/Bowel: The appearance of the stomach is unremarkable. No pathologic dilatation of small bowel or colon. The appendix is not confidently identified and may be surgically absent. Regardless, there are no inflammatory changes noted adjacent to the cecum to suggest the presence of an acute appendicitis at this time. Vascular/Lymphatic: Aortic atherosclerosis with fusiform ectasia of the infrarenal abdominal aorta which measures up to 2.6 cm in diameter. No aneurysm or dissection noted in the abdominal or pelvic vasculature. No lymphadenopathy identified in the abdomen or pelvis. Reproductive: Prostate gland and seminal vesicles are unremarkable in appearance. Other: No significant volume of ascites.  No pneumoperitoneum. Musculoskeletal: Innumerable predominantly lytic lesions are noted  throughout all aspects of the visualized axial and appendicular skeleton, compatible with widespread metastatic disease to the bones. IMPRESSION: 1. Overall, today's examination appears relatively similar to prior studies, with exception of some areas that show mixed response to therapy. Specifically, left hilar lymphadenopathy noted on the prior examination has slightly regressed. However, there is a new hypovascular lesion in the spleen, and a new lesion in the upper pole the left kidney, both of which are concerning for potential new metastatic lesions. Widespread osseous metastatic disease is similar to the prior examination. Left upper lobe pulmonary nodule is unchanged, while the subtle pleural nodule in the posterior aspect of the right middle lobe abutting the major fissure has slightly decreased in size. 2. 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 3. There is also mild diffuse bronchial wall thickening with mild centrilobular and paraseptal emphysema; imaging findings suggestive of underlying COPD. 4. Aneurysmal dilatation of the ascending thoracic aorta (4.8 cm in diameter). Ascending thoracic aortic aneurysm. Recommend semi-annual imaging followup by CTA or MRA and referral to cardiothoracic surgery if not already obtained. This recommendation follows 2010 ACCF/AHA/AATS/ACR/ASA/SCA/SCAI/SIR/STS/SVM Guidelines for the Diagnosis and Management of Patients With Thoracic Aortic Disease. Circulation. 2010; 121: Z610-R604. 5. Additional incidental findings, as above. Aortic Atherosclerosis (ICD10-I70.0) and Emphysema (ICD10-J43.9). Electronically Signed   By: Vinnie Langton M.D.   On: 08/17/2016 13:52   Nm Bone Scan Whole Body  Result Date: 08/17/2016 CLINICAL DATA:  66 year old male with a history of metastatic lung cancer. EXAM: NUCLEAR MEDICINE WHOLE BODY BONE SCAN TECHNIQUE: Whole body anterior and posterior images were obtained approximately 3 hours after intravenous injection of radiopharmaceutical. RADIOPHARMACEUTICALS:  22.3 mCi Technetium-24mMDP IV COMPARISON:  Recent prior bone scan 07/17/2016 ; prior CT scan of the chest abdomen and pelvis 07/13/2016; CT scan of the chest abdomen and pelvis obtained today 08/17/2016 FINDINGS: Multiple punctate foci of increased radiotracer uptake noted throughout the ribs, particularly the right second fifth and sixth ribs, and the left eleventh rib. Comparing to today's CT scan, the amount of increased radiotracer activity is significantly less than the volume of diffuse lytic metastatic disease. IMPRESSION: 1. Similar to the prior nuclear medicine bone scan, the degree of disease burden is significantly less than would be expected compared to the prior PET-CT from 04/10/2016. This may be secondary to many of these lesions being lytic in nature  which may not show up by bone scan, versus interval response to therapy. 2. Persistent foci of increased uptake in the bilateral ribs corresponds with areas of relatively sclerotic metastatic disease on the accompanying CT scan. Electronically Signed   By: HJacqulynn CadetM.D.   On: 08/17/2016 13:10   Ct Abdomen Pelvis W Contrast  Result Date: 08/17/2016 CLINICAL DATA:  66year old male with history of lung cancer status post chemotherapy. Recent history of nausea, vomiting, diarrhea, shortness of breath, chest pain and generalize abdominal pain for the past several weeks. EXAM: CT CHEST, ABDOMEN, AND PELVIS WITH CONTRAST TECHNIQUE: Multidetector CT imaging of the chest, abdomen and pelvis was performed following the standard protocol during bolus administration of intravenous contrast. CONTRAST:  1071mISOVUE-300 IOPAMIDOL (ISOVUE-300) INJECTION 61% COMPARISON:  CT the abdomen and pelvis 07/13/2016. PET-CT 04/10/2016. FINDINGS: CT CHEST FINDINGS Cardiovascular: Heart size is normal. Small amount of anterior pericardial fluid and/or thickening, unlikely to be of hemodynamic significance at this time. No associated pericardial calcification. There is aortic atherosclerosis, as well as atherosclerosis  of the great vessels of the mediastinum and the coronary arteries, including calcified atherosclerotic plaque in the left main, left anterior descending, left circumflex and right coronary arteries. Thickening and mild calcification of the aortic valve. Aneurysmal dilatation of ascending thoracic aorta (4.8 cm in diameter). Right internal jugular single-lumen porta cath with tip terminating at the superior cavoatrial junction. Mediastinum/Nodes: Multiple borderline enlarged and mildly enlarged mediastinal and bilateral hilar lymph nodes are noted, measuring up to 1.3 cm in the low left paratracheal nodal station, 1 cm in the right hilar nodal station and 1 cm in the subcarinal nodal station. Previously noted left  hilar lymphadenopathy has slightly regressed. Esophagus is normal in appearance. No axillary lymphadenopathy. Lungs/Pleura: The previously noted nodule in the superior segment of the left lower lobe is stable in size measuring 11 x 8 mm (axial image 41 of series 4). Previously noted subpleural nodule in the posterior aspect of the right middle lobe abutting the major fissure (axial image 114 of series 4) is smaller than the prior study, measuring only 7 mm on today's examination. No acute consolidative airspace disease. No pleural effusions. Diffuse bronchial wall thickening with mild centrilobular and paraseptal emphysema. The patchy areas of extensive septal thickening with areas of mild peripheral cylindrical bronchiectasis and peripheral bronchiolectasis Musculoskeletal: Numerous predominantly lytic lesions are noted throughout the visualized axial and appendicular skeleton, compatible with widespread metastatic disease to the bones, including multiple rib lesions bilaterally, several of which demonstrate nondisplaced and minimally displaced pathologic fractures. CT ABDOMEN PELVIS FINDINGS Hepatobiliary: Again noted are innumerable hypovascular lesions scattered throughout the liver. When compared to the prior examination, direct comparison between individual lesions is challenging given the sheer number of lesions, however, the largest lesion seen on the prior study is are slightly larger than the prior examination measuring approximately 3.0 x 3.2 cm on today's study (axial image 64 of series 2) between segments 4A and 4B. No intra or extrahepatic biliary ductal dilatation. Innumerable tiny calcified gallstones lie dependently in the gallbladder. No findings to suggest an acute cholecystitis are noted at this time. Pancreas: No pancreatic mass. No pancreatic ductal dilatation. No pancreatic or peripancreatic fluid or inflammatory changes. Spleen: New hypovascular lesion measuring 2.8 cm in the superior aspect  of the spleen, concerning for new metastatic lesion. Adrenals/Urinary Tract: New 1.4 cm intermediate attenuation lesion in the upper pole of the left kidney suspicious for a metastatic lesion (axial image 66 of series 2). Tiny subcentimeter low-attenuation lesions in the right kidney are too small to definitively characterize, but are similar to the prior study, likely to represent tiny cysts. No hydroureteronephrosis. Urinary bladder is normal in appearance. Stomach/Bowel: The appearance of the stomach is unremarkable. No pathologic dilatation of small bowel or colon. The appendix is not confidently identified and may be surgically absent. Regardless, there are no inflammatory changes noted adjacent to the cecum to suggest the presence of an acute appendicitis at this time. Vascular/Lymphatic: Aortic atherosclerosis with fusiform ectasia of the infrarenal abdominal aorta which measures up to 2.6 cm in diameter. No aneurysm or dissection noted in the abdominal or pelvic vasculature. No lymphadenopathy identified in the abdomen or pelvis. Reproductive: Prostate gland and seminal vesicles are unremarkable in appearance. Other: No significant volume of ascites.  No pneumoperitoneum. Musculoskeletal: Innumerable predominantly lytic lesions are noted throughout all aspects of the visualized axial and appendicular skeleton, compatible with widespread metastatic disease to the bones. IMPRESSION: 1. Overall, today's examination appears relatively similar to prior studies, with exception of some areas  that show mixed response to therapy. Specifically, left hilar lymphadenopathy noted on the prior examination has slightly regressed. However, there is a new hypovascular lesion in the spleen, and a new lesion in the upper pole the left kidney, both of which are concerning for potential new metastatic lesions. Widespread osseous metastatic disease is similar to the prior examination. Left upper lobe pulmonary nodule is  unchanged, while the subtle pleural nodule in the posterior aspect of the right middle lobe abutting the major fissure has slightly decreased in size. 2. 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 3. There is also mild diffuse bronchial wall thickening with mild centrilobular and paraseptal emphysema; imaging findings suggestive of underlying COPD. 4. Aneurysmal dilatation of the ascending thoracic aorta (4.8 cm in diameter). Ascending thoracic aortic aneurysm. Recommend semi-annual imaging followup by CTA or MRA and referral to cardiothoracic surgery if not already obtained. This recommendation follows 2010 ACCF/AHA/AATS/ACR/ASA/SCA/SCAI/SIR/STS/SVM Guidelines for the Diagnosis and Management of Patients With Thoracic Aortic Disease. Circulation. 2010; 121: E527-P824. 5. Additional incidental findings, as above. Aortic Atherosclerosis (ICD10-I70.0) and Emphysema (ICD10-J43.9). Electronically Signed   By: Vinnie Langton M.D.   On: 08/17/2016 13:52     Assessment and plan- Patient is a 66 y.o. 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 Spleen lesion appears more like an infarct. There is a new kidney lesion and overall disease in the liver, bone and LN show mixed response. Some areas appear worse some better. His CEA has more than doubled. He also had worsening LFT's after receiving 5 doses of keytruda. For these reasons, I would recommend switching to second line chemotherapy with single agent docetaxel if he is not a candidate for clinical trial awaiting to hear back from Dr. Aniceto Boss regarding his referral  Thrombocytopenia- like due to chemotherapy- last dose of alimta/keytruda on 08/10/16  Abnormal LFT's- improved today. Bilirubin has normalized. continue to monitor. Possibly due to  [place]  Nausea- will add olanzapine 10 mg QHS. Continue prn compazine  Patient still thinking about his CODE status and goals of care. 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 He will let me know if he would like a referral  He is due for xgeva in 2 weeks  I will wait to hear back from [facility] tomorrow and make follow up plans accordingly     Visit Diagnosis 1. Bone metastases ([place])   2. Primary malignant neoplasm of lung metastatic to other site, unspecified laterality ([place])   3. Nausea and vomiting, intractability of vomiting not specified, unspecified vomiting type      Dr. Randa Evens, MD, MPH [place] [facility] Pager- [phone] 08/24/2016 8:27 AM

## 2016-08-31 ENCOUNTER — Other Ambulatory Visit: Payer: Medicare HMO

## 2016-08-31 ENCOUNTER — Ambulatory Visit: Payer: Medicare HMO

## 2016-08-31 ENCOUNTER — Ambulatory Visit: Payer: Medicare HMO | Admitting: Oncology

## 2016-08-31 ENCOUNTER — Telehealth: Payer: Self-pay | Admitting: *Deleted

## 2016-08-31 NOTE — Telephone Encounter (Signed)
Called daughter and left her a message that I read the note from Dr. Georgeanna Lea from [facility] and he suggested that pt go on docetaxel. I spoke to covering provider Dr. Rogue Bussing and suggest to get started on it. I wanted to see if Austin Bryan and her Dad and spoek about and made decision and if so I can get him started on regimen. Will await her call back and left my direct number

## 2016-09-04 ENCOUNTER — Other Ambulatory Visit: Payer: Self-pay | Admitting: Hematology and Oncology

## 2016-09-04 ENCOUNTER — Telehealth: Payer: Self-pay | Admitting: *Deleted

## 2016-09-04 NOTE — Telephone Encounter (Signed)
Asking that Austin Bryan call her back

## 2016-09-04 NOTE — Progress Notes (Signed)
DISCONTINUE ON PATHWAY REGIMEN - Non-Small Cell Lung     A cycle is every 21 days:     Pemetrexed      Carboplatin   **Always confirm dose/schedule in your pharmacy ordering system**    REASON: Disease Progression PRIOR TREATMENT: [id number]: Carboplatin AUC=5 + Pemetrexed 500 mg/m2 q21 Days x 4 Cycles TREATMENT RESPONSE: Progressive Disease (PD)  START ON PATHWAY REGIMEN - Non-Small Cell Lung     A cycle is every 21 days:     Docetaxel   **Always confirm dose/schedule in your pharmacy ordering system**    Patient Characteristics: Stage IV Metastatic, Non Squamous, Second Line - Chemotherapy/Immunotherapy, PS = 2, Prior PD-1/PD-L1  Inhibitor or Not a Candidate for Immunotherapy AJCC T Category: TX Current Disease Status: Distant Metastases AJCC N Category: NX AJCC M Category: M1c AJCC 8 Stage Grouping: IVB Histology: Non Squamous Cell ROS1 Rearrangement Status: Negative T790M Mutation Status: Not Applicable - EGFR Mutation Negative/Unknown Other Mutations/Biomarkers: Yes PD-L1 Expression Status: PD-L1 Positive >= 50% (TPS) Chemotherapy/Immunotherapy LOT: Second Line Chemotherapy/Immunotherapy Molecular Targeted Therapy: Not Appropriate ALK Translocation Status: Negative Would you be surprised if this patient died  in the next year? I would NOT be surprised if this patient died in the next year EGFR Mutation Status: Negative/Wild Type BRAF V600E Mutation Status: Negative Performance Status: PS = 2 Immunotherapy Candidate Status: Not a Candidate for Immunotherapy Prior Immunotherapy Status: Prior PD-1/PD-L1 Inhibitor Intent of Therapy: Non-Curative / Palliative Intent, Discussed with Patient

## 2016-09-04 NOTE — Telephone Encounter (Signed)
Called daughter back and she is agreeable to starting new treatment taxotere that dum[facility] rec.  I will call her back with date and time for Friday.

## 2016-09-04 NOTE — Telephone Encounter (Signed)
Called daughter cheryl and left her voicemail that pt needs to come this Friday at 8:30 for labs, see md and then get treatment.  I asked her if it is ok if she could call me and confirm she got my message and it is good or needs to be changed at [phone]

## 2016-09-06 ENCOUNTER — Telehealth: Payer: Self-pay | Admitting: *Deleted

## 2016-09-06 ENCOUNTER — Other Ambulatory Visit: Payer: Self-pay | Admitting: *Deleted

## 2016-09-06 DIAGNOSIS — C3490 Malignant neoplasm of unspecified part of unspecified bronchus or lung: Secondary | ICD-10-CM

## 2016-09-06 DIAGNOSIS — Z5111 Encounter for antineoplastic chemotherapy: Secondary | ICD-10-CM

## 2016-09-06 DIAGNOSIS — C7951 Secondary malignant neoplasm of bone: Secondary | ICD-10-CM

## 2016-09-06 MED ORDER — DEXAMETHASONE 4 MG PO TABS: ORAL_TABLET | ORAL | 1 refills | Status: DC

## 2016-09-06 NOTE — Telephone Encounter (Signed)
Need prescription for Decadron

## 2016-09-06 NOTE — Telephone Encounter (Signed)
Called daughter to let her know that pt will need to start decadron the day before chemo and then the day after chemo. Dr. Mike Gip released the rx and it went to his pharmacy and I just wanted to be sure that she knew how pt needs to take it and if she picked it up because he needs to start it today. Asked her to call me back.

## 2016-09-07 ENCOUNTER — Ambulatory Visit
Admission: RE | Admit: 2016-09-07 | Discharge: 2016-09-07 | Disposition: A | Discharge status: Home / Self Care | Payer: Medicare HMO | Source: Ambulatory Visit | Attending: Urgent Care | Admitting: Urgent Care

## 2016-09-07 ENCOUNTER — Ambulatory Visit: Payer: Medicare HMO

## 2016-09-07 ENCOUNTER — Encounter: Payer: Self-pay | Admitting: Hematology and Oncology

## 2016-09-07 ENCOUNTER — Inpatient Hospital Stay: Payer: Medicare HMO

## 2016-09-07 ENCOUNTER — Inpatient Hospital Stay (HOSPITAL_BASED_OUTPATIENT_CLINIC_OR_DEPARTMENT_OTHER): Payer: Medicare HMO | Admitting: Hematology and Oncology

## 2016-09-07 ENCOUNTER — Telehealth: Payer: Self-pay | Admitting: *Deleted

## 2016-09-07 VITALS — BP 105/73 | HR 91 | Temp 95.3°F | Resp 18 | Wt 151.6 lb

## 2016-09-07 DIAGNOSIS — K8020 Calculus of gallbladder without cholecystitis without obstruction: Secondary | ICD-10-CM | POA: Diagnosis not present

## 2016-09-07 DIAGNOSIS — J439 Emphysema, unspecified: Secondary | ICD-10-CM

## 2016-09-07 DIAGNOSIS — R7989 Other specified abnormal findings of blood chemistry: Secondary | ICD-10-CM | POA: Diagnosis present

## 2016-09-07 DIAGNOSIS — C787 Secondary malignant neoplasm of liver and intrahepatic bile duct: Secondary | ICD-10-CM | POA: Insufficient documentation

## 2016-09-07 DIAGNOSIS — D6959 Other secondary thrombocytopenia: Secondary | ICD-10-CM

## 2016-09-07 DIAGNOSIS — D696 Thrombocytopenia, unspecified: Secondary | ICD-10-CM

## 2016-09-07 DIAGNOSIS — R948 Abnormal results of function studies of other organs and systems: Secondary | ICD-10-CM

## 2016-09-07 DIAGNOSIS — D7389 Other diseases of spleen: Secondary | ICD-10-CM

## 2016-09-07 DIAGNOSIS — R634 Abnormal weight loss: Secondary | ICD-10-CM

## 2016-09-07 DIAGNOSIS — N289 Disorder of kidney and ureter, unspecified: Secondary | ICD-10-CM

## 2016-09-07 DIAGNOSIS — C3490 Malignant neoplasm of unspecified part of unspecified bronchus or lung: Secondary | ICD-10-CM

## 2016-09-07 DIAGNOSIS — R945 Abnormal results of liver function studies: Secondary | ICD-10-CM

## 2016-09-07 DIAGNOSIS — Z9221 Personal history of antineoplastic chemotherapy: Secondary | ICD-10-CM

## 2016-09-07 DIAGNOSIS — C3411 Malignant neoplasm of upper lobe, right bronchus or lung: Secondary | ICD-10-CM

## 2016-09-07 DIAGNOSIS — C7951 Secondary malignant neoplasm of bone: Secondary | ICD-10-CM

## 2016-09-07 DIAGNOSIS — F17210 Nicotine dependence, cigarettes, uncomplicated: Secondary | ICD-10-CM

## 2016-09-07 DIAGNOSIS — E119 Type 2 diabetes mellitus without complications: Secondary | ICD-10-CM

## 2016-09-07 DIAGNOSIS — Z7189 Other specified counseling: Secondary | ICD-10-CM | POA: Insufficient documentation

## 2016-09-07 DIAGNOSIS — C778 Secondary and unspecified malignant neoplasm of lymph nodes of multiple regions: Secondary | ICD-10-CM

## 2016-09-07 DIAGNOSIS — Z79899 Other long term (current) drug therapy: Secondary | ICD-10-CM

## 2016-09-07 DIAGNOSIS — R112 Nausea with vomiting, unspecified: Secondary | ICD-10-CM

## 2016-09-07 DIAGNOSIS — I712 Thoracic aortic aneurysm, without rupture: Secondary | ICD-10-CM

## 2016-09-07 DIAGNOSIS — Z5111 Encounter for antineoplastic chemotherapy: Secondary | ICD-10-CM | POA: Insufficient documentation

## 2016-09-07 DIAGNOSIS — R599 Enlarged lymph nodes, unspecified: Secondary | ICD-10-CM

## 2016-09-07 DIAGNOSIS — I2510 Atherosclerotic heart disease of native coronary artery without angina pectoris: Secondary | ICD-10-CM

## 2016-09-07 DIAGNOSIS — R630 Anorexia: Secondary | ICD-10-CM

## 2016-09-07 DIAGNOSIS — Z7984 Long term (current) use of oral hypoglycemic drugs: Secondary | ICD-10-CM

## 2016-09-07 DIAGNOSIS — I700 Atherosclerosis of aorta: Secondary | ICD-10-CM

## 2016-09-07 DIAGNOSIS — J3801 Paralysis of vocal cords and larynx, unilateral: Secondary | ICD-10-CM

## 2016-09-07 LAB — CBC WITH DIFFERENTIAL/PLATELET
Basophils Absolute: 0 10*3/uL (ref 0–0.1)
Basophils Relative: 0 %
Eosinophils Absolute: 0 10*3/uL (ref 0–0.7)
Eosinophils Relative: 0 %
HCT: 36.4 % — ABNORMAL LOW (ref 40.0–52.0)
Hemoglobin: 12.6 g/dL — ABNORMAL LOW (ref 13.0–18.0)
Lymphocytes Relative: 4 %
Lymphs Abs: 0.5 10*3/uL — ABNORMAL LOW (ref 1.0–3.6)
MCH: 36.7 pg — ABNORMAL HIGH (ref 26.0–34.0)
MCHC: 34.6 g/dL (ref 32.0–36.0)
MCV: 106.1 fL — ABNORMAL HIGH (ref 80.0–100.0)
Monocytes Absolute: 1.6 10*3/uL — ABNORMAL HIGH (ref 0.2–1.0)
Monocytes Relative: 14 %
Neutro Abs: 9.4 10*3/uL — ABNORMAL HIGH (ref 1.4–6.5)
Neutrophils Relative %: 82 %
Platelets: 19 10*3/uL — CL (ref 150–400)
RBC: 3.43 MIL/uL — ABNORMAL LOW (ref 4.40–5.90)
RDW: 17 % — ABNORMAL HIGH (ref 11.5–14.5)
Smear Review: DECREASED
WBC: 11.5 10*3/uL — ABNORMAL HIGH (ref 3.8–10.6)

## 2016-09-07 LAB — COMPREHENSIVE METABOLIC PANEL
ALT: 85 U/L — ABNORMAL HIGH (ref 17–63)
AST: 160 U/L — ABNORMAL HIGH (ref 15–41)
Albumin: 2.8 g/dL — ABNORMAL LOW (ref 3.5–5.0)
Alkaline Phosphatase: 328 U/L — ABNORMAL HIGH (ref 38–126)
Anion gap: 12 (ref 5–15)
BUN: 16 mg/dL (ref 6–20)
CO2: 21 mmol/L — ABNORMAL LOW (ref 22–32)
Calcium: 8.2 mg/dL — ABNORMAL LOW (ref 8.9–10.3)
Chloride: 97 mmol/L — ABNORMAL LOW (ref 101–111)
Creatinine, Ser: 0.64 mg/dL (ref 0.61–1.24)
GFR calc Af Amer: >60 mL/min (ref >60)
GFR calc non Af Amer: >60 mL/min (ref >60)
Glucose, Bld: 120 mg/dL — ABNORMAL HIGH (ref 65–99)
Potassium: 4.6 mmol/L (ref 3.5–5.1)
Sodium: 130 mmol/L — ABNORMAL LOW (ref 135–145)
Total Bilirubin: 2.1 mg/dL — ABNORMAL HIGH (ref 0.3–1.2)
Total Protein: 6.3 g/dL — ABNORMAL LOW (ref 6.5–8.1)

## 2016-09-07 LAB — MAGNESIUM: Magnesium: 1.9 mg/dL (ref 1.7–2.4)

## 2016-09-07 MED ORDER — HEPARIN SOD (PORK) LOCK FLUSH 100 UNIT/ML IV SOLN: 500.0000 [IU] | Freq: Once | INTRAVENOUS | Status: AC

## 2016-09-07 MED ORDER — SODIUM CHLORIDE 0.9% FLUSH
10.0000 mL | Freq: Once | INTRAVENOUS | Status: AC
  Administered 2016-09-07: 10 mL via INTRAVENOUS
  Filled 2016-09-07: qty 10

## 2016-09-07 NOTE — Progress Notes (Signed)
Patient states he is having problems with dry eyes, lips and mouth.

## 2016-09-07 NOTE — Telephone Encounter (Signed)
Critical lab - Platelets 19  MD notified.

## 2016-09-07 NOTE — Progress Notes (Signed)
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 Patient is a 66 year old male who was seen by Dr. walk from ENT for symptoms of hoarseness of voice for 1 month. 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 Patient is a chronic smoker and has smoked about 1-1/2 packs per day for more than 30 years. He has lost about 17 pounds of weight over the last 6-7 months. Patient lives with his sister and is independent of hisADLs and IADLs.  2. CT soft tissue of the neck on 04/02/2016 showed: 1. Soft tissue mass along the undersurface of the aortic arch is most concerning for a metastatic adenopathy. This likely impacts the left recurrent laryngeal nerve leading to vocal cord paralysis. 2. Extensive mediastinal adenopathy compatible with metastatic disease. 3. Numerous lytic lesions throughout the visualized is cervical and thoracic spine as well is bilateral ribs. These are compatible with additional metastases. 4. 12 mm pleural-based nodule in the left upper lid may represent a primary bronchogenic neoplasm. This is more likely metastatic disease. 5. Findings are most concerning for a primary lung cancer. Recommend CT of the chest, abdomen, and pelvis with contrast for further evaluation of the primary neoplasm and extent of metastatic disease.  3. PET CT scan on 04/10/2016 showed:IMPRESSION:1. Extensive lytic malignancy in the skeleton with heavy burden of hepatic metastatic lesions, adenopathy in the chest, lower neck, and likely porta hepatis compatible with malignancy ; and a small left upper lobe pulmonary nodule which is likewise  hypermetabolic. The bony disease resembles myeloma but it would be unusual to have this much hepatic and soft tissue involvement in the setting of myeloma. Other possibilities might include an unusually aggressive central lung cancer. Tissue diagnosis recommended. 2. AP window mass is indistinct and may be interfering with the recurrent laryngeal nerve. The asymmetric appearance of the cords is probably secondary to this interference. 3. 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 Cholelithiasis.  4. US guided liver biopsy showed: DIAGNOSIS:  A. LIVER MASS, LEFT LOBE; ULTRASOUND GUIDED BIOPSY:  - METASTATIC NON-SMALL CELL CARCINOMA, TTF-1 POSITIVE.   Comment:  A limited panel of immunohistochemical stains was performed. The  metastatic malignant neoplasm demonstrates the following pattern of  immunoreactivity:  Super pancytokeratin: Positive  TTF-1: Positive  CD56: Negative  CDX-2: Negative  Stain controls worked appropriately. The morphologic findings are  consistent with metastatic poorly differentiated carcinoma. The presence  of TTF-1 positivity is compatible with lung origin.   5. Cycle 1 carbo/alimta given on 04/26/16. He gets monthly X geva. CEA elevated at 7641 at baseline. keytruda added to cycle 2  6. Foundation one testing showed no EGFR, ALK, ROS and BRAF mutations. PDL1 expression was 70%. No other actionable mutations  7. Interim scans after 4 cycles of chemo/immunotherapy showed: Marland Kitchen Mixed response to therapy. Specifically, although there has been slight regression of previously noted left upper lobe pulmonary nodule, extensive mediastinal and left hilar lymphadenopathy appears slightly progressive and there is been enlargement over right middle lobe pulmonary nodule. Previously noted widespread metastatic disease to the liver and bones  appear  similar to the prior Examination   Interval history-  He was last seen by Dr. Janese Banks on 08/23/2016.  At that time, he was seen for a sick call visit.  He had nausea and was using 3-4 doses of compazine daily.  Olanzapine was added.  Discussions were held regarding direction of therapy given his mixed response to treatment.  He was referred to Dr. Nori Riis Ready at [facility]. His performance status, blood counts, and liver function were all borderline for clinical trial eligibility. It was felt to bein the patient's best interest to proceed with standard therapy chemotherapy such as docetaxel.   Symptomatically, patient feels "better" today. He reports that he slept really well last night as compared to normal. He notes weakness; states, "I am not as weak though". Diet is mainly liquids at this time.  He is using supplement shakes. Continued nausea and vomiting. Activity is reduced; "I sit most of the day"; able to complete all ADLs without assistance. Patient with RIGHT rib pain.    ECOG PS- 2 Pain scale- 0 Opioid associated constipation- no  Review of systems- Review of Systems  Constitutional: Positive for malaise/fatigue. Negative for chills, fever and weight loss.  HENT: Negative for congestion, ear discharge and nosebleeds.   Eyes: Negative for blurred vision.  Respiratory: Negative for cough, hemoptysis, sputum production, shortness of breath and wheezing.   Cardiovascular: Negative for chest pain, palpitations, orthopnea and claudication.  Gastrointestinal: Positive for abdominal pain, nausea and vomiting. Negative for blood in stool, constipation, diarrhea, heartburn and melena.       RUQ abdominal pain.  Genitourinary: Negative for dysuria, flank pain, frequency, hematuria and urgency.       No change in urine color.  Musculoskeletal: Negative for back pain, joint pain and myalgias.       Right sided rib pain.  Skin: Negative for rash.       No excess bruising.  Neurological: Positive  for weakness. Negative for dizziness, tingling, focal weakness, seizures and headaches.  Endo/Heme/Allergies: Does not bruise/bleed easily.  Psychiatric/Behavioral: Negative for depression and suicidal ideas. The patient does not have insomnia.        Slept better last night.     No Known Allergies   Past Medical History:  Diagnosis Date  . Allergy   . Diabetes mellitus without complication ([place])   . Dyspnea   . Lung cancer ([place]) 03/2016   Chemo tx's.     Past Surgical History:  Procedure Laterality Date  . ADENOIDECTOMY    . COLONOSCOPY    . IR FLUORO GUIDE PORT INSERTION RIGHT  04/16/2016  . TONSILLECTOMY      Social History   Social History  . Marital status: Divorced    Spouse name: N/A  . Number of children: N/A  . Years of education: N/A   Occupational History  . Not on file.   Social History Main Topics  . Smoking status: Current Every Day Smoker    Packs/day: 1.00    Years: 40.00    Types: Cigarettes  . Smokeless tobacco: Never Used  . Alcohol use No  . Drug use: No  . Sexual activity: Not on file   Other Topics Concern  . Not on file   Social History Narrative  . No narrative on file    Family History  Problem Relation Age of Onset  . Cancer Mother   . Cancer Father      Current Outpatient Prescriptions:  .  chlorproMAZINE (THORAZINE) 25 MG  tablet, Take 1 tablet (25 mg total) by mouth every 6 (six) hours as needed., Disp: 30 tablet, Rfl: 0 .  dexamethasone (DECADRON) 4 MG tablet, Take 2 tabs two times a day the day before and the day after  chemo, Disp: 30 tablet, Rfl: 1 .  fexofenadine (ALLEGRA) 180 MG tablet, Take 1 tablet (180 mg total) by mouth daily., Disp: 90 tablet, Rfl: 2 .  fluticasone (FLONASE) 50 MCG/ACT nasal spray, Place 2 sprays into both nostrils daily., Disp: , Rfl:  .  metFORMIN (GLUCOPHAGE) 500 MG tablet, Take 1 tablet (500 mg total) by mouth 2 (two) times daily with a meal., Disp: 180 tablet, Rfl: 2 .  Morphine Sulfate  (MORPHINE CONCENTRATE) 10 mg / 0.5 ml concentrated solution, Take by mouth every 4 (four) hours as needed for severe pain. 0.25 ml every 4 hours as needed (prescribed by Dr. Lew Dawes), Disp: , Rfl:  .  OLANZapine (ZYPREXA) 10 MG tablet, Take 1 tablet (10 mg total) by mouth at bedtime., Disp: 30 tablet, Rfl: 2 .  prochlorperazine (COMPAZINE) 10 MG tablet, Take 1 tablet (10 mg total) by mouth every 6 (six) hours as needed (Nausea or vomiting)., Disp: 30 tablet, Rfl: 1 .  ranitidine (ZANTAC) 150 MG tablet, Take 1 tablet (150 mg total) by mouth 2 (two) times daily., Disp: 180 tablet, Rfl: 2 .  senna (SENOKOT) 8.6 MG TABS tablet, Take 2 tablets (17.2 mg total) by mouth at bedtime., Disp: 120 each, Rfl: 0 .  lidocaine (LIDODERM) 5 %, Place 1 patch onto the skin daily. Remove & Discard patch within 12 hours or as directed by MD (Patient not taking: Reported on 08/23/2016), Disp: 30 patch, Rfl: 0 .  morphine (MS CONTIN) 15 MG 12 hr tablet, Take 1 tablet (15 mg total) by mouth every 12 (twelve) hours. (Patient not taking: Reported on 08/23/2016), Disp: 60 tablet, Rfl: 0 No current facility-administered medications for this visit.   Facility-Administered Medications Ordered in Other Visits:  .  heparin lock flush 100 unit/mL, 500 Units, Intravenous, Once, Lequita Asal, MD  Physical exam:  Vitals:   09/07/16 0903  BP: 105/73  Pulse: 91  Resp: 18  Temp: (!) 95.3 F (35.2 C)  TempSrc: Tympanic  Weight: 151 lb 9 oz (68.7 kg)   Physical Exam  Constitutional: He is oriented to person, place, and time.  Chronically fatigued appearing. Sitting in a wheelchair in no acute distress.  HENT:  Head: Normocephalic and atraumatic.  Eyes: Pupils are equal, round, and reactive to light. EOM are normal. Scleral icterus is present.  Neck: Normal range of motion.  Cardiovascular: Normal rate, regular rhythm and normal heart sounds.   Pulmonary/Chest: Effort normal and breath sounds normal.  Abdominal: Soft.  Bowel sounds are normal. He exhibits no distension. There is tenderness. There is no rebound and no guarding.  Liver tender and palpable 10 cm below RCM.  Musculoskeletal: He exhibits no edema or tenderness.  Neurological: He is alert and oriented to person, place, and time.  Skin: Skin is warm and dry.  Psychiatric: Mood, memory, affect and judgment normal.     CMP Latest Ref Rng & Units 09/07/2016  Glucose 65 - 99 mg/dL 120(H)  BUN 6 - 20 mg/dL 16  Creatinine 0.61 - 1.24 mg/dL 0.64  Sodium 135 - 145 mmol/L 130(L)  Potassium 3.5 - 5.1 mmol/L 4.6  Chloride 101 - 111 mmol/L 97(L)  CO2 22 - 32 mmol/L 21(L)  Calcium 8.9 - 10.3 mg/dL 8.2(L)  Total Protein 6.5 - 8.1 g/dL 6.3(L)  Total Bilirubin 0.3 - 1.2 mg/dL 2.1(H)  Alkaline Phos 38 - 126 U/L 328(H)  AST 15 - 41 U/L 160(H)  ALT 17 - 63 U/L 85(H)   CBC Latest Ref Rng & Units 09/07/2016  WBC 3.8 - 10.6 K/uL 11.5(H)  Hemoglobin 13.0 - 18.0 g/dL 12.6(L)  Hematocrit 40.0 - 52.0 % 36.4(L)  Platelets 150 - 400 K/uL 19(LL)    No images are attached to the encounter.  Ct Chest W Contrast  Result Date: 08/17/2016 CLINICAL DATA:  66 year old male with history of lung cancer status post chemotherapy. Recent history of nausea, vomiting, diarrhea, shortness of breath, chest pain and generalize abdominal pain for the past several weeks. EXAM: CT CHEST, ABDOMEN, AND PELVIS WITH CONTRAST TECHNIQUE: Multidetector CT imaging of the chest, abdomen and pelvis was performed following the standard protocol during bolus administration of intravenous contrast. CONTRAST:  126m ISOVUE-300 IOPAMIDOL (ISOVUE-300) INJECTION 61% COMPARISON:  CT the abdomen and pelvis 07/13/2016. PET-CT 04/10/2016. FINDINGS: CT CHEST FINDINGS Cardiovascular: Heart size is normal. Small amount of anterior pericardial fluid and/or thickening, unlikely to be of hemodynamic significance at this time. No associated pericardial calcification. There is aortic atherosclerosis, as well as  atherosclerosis of the great vessels of the mediastinum and the coronary arteries, including calcified atherosclerotic plaque in the left main, left anterior descending, left circumflex and right coronary arteries. Thickening and mild calcification of the aortic valve. Aneurysmal dilatation of ascending thoracic aorta (4.8 cm in diameter). Right internal jugular single-lumen porta cath with tip terminating at the superior cavoatrial junction. Mediastinum/Nodes: Multiple borderline enlarged and mildly enlarged mediastinal and bilateral hilar lymph nodes are noted, measuring up to 1.3 cm in the low left paratracheal nodal station, 1 cm in the right hilar nodal station and 1 cm in the subcarinal nodal station. Previously noted left hilar lymphadenopathy has slightly regressed. Esophagus is normal in appearance. No axillary lymphadenopathy. Lungs/Pleura: The previously noted nodule in the superior segment of the left lower lobe is stable in size measuring 11 x 8 mm (axial image 41 of series 4). Previously noted subpleural nodule in the posterior aspect of the right middle lobe abutting the major fissure (axial image 114 of series 4) is smaller than the prior study, measuring only 7 mm on today's examination. No acute consolidative airspace disease. No pleural effusions. Diffuse bronchial wall thickening with mild centrilobular and paraseptal emphysema. The patchy areas of extensive septal thickening with areas of mild peripheral cylindrical bronchiectasis and peripheral bronchiolectasis Musculoskeletal: Numerous predominantly lytic lesions are noted throughout the visualized axial and appendicular skeleton, compatible with widespread metastatic disease to the bones, including multiple rib lesions bilaterally, several of which demonstrate nondisplaced and minimally displaced pathologic fractures. CT ABDOMEN PELVIS FINDINGS Hepatobiliary: Again noted are innumerable hypovascular lesions scattered throughout the liver.  When compared to the prior examination, direct comparison between individual lesions is challenging given the sheer number of lesions, however, the largest lesion seen on the prior study is are slightly larger than the prior examination measuring approximately 3.0 x 3.2 cm on today's study (axial image 64 of series 2) between segments 4A and 4B. No intra or extrahepatic biliary ductal dilatation. Innumerable tiny calcified gallstones lie dependently in the gallbladder. No findings to suggest an acute cholecystitis are noted at this time. Pancreas: No pancreatic mass. No pancreatic ductal dilatation. No pancreatic or peripancreatic fluid or inflammatory changes. Spleen: New hypovascular lesion measuring 2.8 cm in the superior aspect of the spleen, concerning  for new metastatic lesion. Adrenals/Urinary Tract: New 1.4 cm intermediate attenuation lesion in the upper pole of the left kidney suspicious for a metastatic lesion (axial image 66 of series 2). Tiny subcentimeter low-attenuation lesions in the right kidney are too small to definitively characterize, but are similar to the prior study, likely to represent tiny cysts. No hydroureteronephrosis. Urinary bladder is normal in appearance. Stomach/Bowel: The appearance of the stomach is unremarkable. No pathologic dilatation of small bowel or colon. The appendix is not confidently identified and may be surgically absent. Regardless, there are no inflammatory changes noted adjacent to the cecum to suggest the presence of an acute appendicitis at this time. Vascular/Lymphatic: Aortic atherosclerosis with fusiform ectasia of the infrarenal abdominal aorta which measures up to 2.6 cm in diameter. No aneurysm or dissection noted in the abdominal or pelvic vasculature. No lymphadenopathy identified in the abdomen or pelvis. Reproductive: Prostate gland and seminal vesicles are unremarkable in appearance. Other: No significant volume of ascites.  No pneumoperitoneum.  Musculoskeletal: Innumerable predominantly lytic lesions are noted throughout all aspects of the visualized axial and appendicular skeleton, compatible with widespread metastatic disease to the bones. IMPRESSION: 1. Overall, today's examination appears relatively similar to prior studies, with exception of some areas that show mixed response to therapy. Specifically, left hilar lymphadenopathy noted on the prior examination has slightly regressed. However, there is a new hypovascular lesion in the spleen, and a new lesion in the upper pole the left kidney, both of which are concerning for potential new metastatic lesions. Widespread osseous metastatic disease is similar to the prior examination. Left upper lobe pulmonary nodule is unchanged, while the subtle pleural nodule in the posterior aspect of the right middle lobe abutting the major fissure has slightly decreased in size. 2. 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 3. There is also mild diffuse bronchial wall thickening with mild centrilobular and paraseptal emphysema; imaging findings suggestive of underlying COPD. 4. Aneurysmal dilatation of the ascending thoracic aorta (4.8 cm in diameter). Ascending thoracic aortic aneurysm. Recommend semi-annual imaging followup by CTA or MRA and referral to cardiothoracic surgery if not already obtained. This recommendation follows 2010 ACCF/AHA/AATS/ACR/ASA/SCA/SCAI/SIR/STS/SVM Guidelines for the Diagnosis and Management of Patients With Thoracic Aortic Disease. Circulation. 2010; 121: G387-F643. 5. Additional incidental findings, as above. Aortic Atherosclerosis (ICD10-I70.0) and Emphysema (ICD10-J43.9). Electronically Signed   By: Vinnie Langton M.D.   On: 08/17/2016 13:52   Nm Bone Scan Whole Body  Result Date: 08/17/2016 CLINICAL DATA:  66 year old male with a history of metastatic lung  cancer. EXAM: NUCLEAR MEDICINE WHOLE BODY BONE SCAN TECHNIQUE: Whole body anterior and posterior images were obtained approximately 3 hours after intravenous injection of radiopharmaceutical. RADIOPHARMACEUTICALS:  22.3 mCi Technetium-28mMDP IV COMPARISON:  Recent prior bone scan 07/17/2016 ; prior CT scan of the chest abdomen and pelvis 07/13/2016; CT scan of the chest abdomen and pelvis obtained today 08/17/2016 FINDINGS: Multiple punctate foci of increased radiotracer uptake noted throughout the ribs, particularly the right second fifth and sixth ribs, and the left eleventh rib. Comparing to today's CT scan, the amount of increased radiotracer activity is significantly less than the volume of diffuse lytic metastatic disease. IMPRESSION: 1. Similar to the prior nuclear medicine bone scan, the degree of disease burden is significantly less than would be expected compared to the prior PET-CT from 04/10/2016. This may be secondary to many of these lesions being lytic in nature which may  not show up by bone scan, versus interval response to therapy. 2. Persistent foci of increased uptake in the bilateral ribs corresponds with areas of relatively sclerotic metastatic disease on the accompanying CT scan. Electronically Signed   By: Jacqulynn Cadet M.D.   On: 08/17/2016 13:10   Ct Abdomen Pelvis W Contrast  Result Date: 08/17/2016 CLINICAL DATA:  66 year old male with history of lung cancer status post chemotherapy. Recent history of nausea, vomiting, diarrhea, shortness of breath, chest pain and generalize abdominal pain for the past several weeks. EXAM: CT CHEST, ABDOMEN, AND PELVIS WITH CONTRAST TECHNIQUE: Multidetector CT imaging of the chest, abdomen and pelvis was performed following the standard protocol during bolus administration of intravenous contrast. CONTRAST:  179m ISOVUE-300 IOPAMIDOL (ISOVUE-300) INJECTION 61% COMPARISON:  CT the abdomen and pelvis 07/13/2016. PET-CT 04/10/2016. FINDINGS: CT CHEST  FINDINGS Cardiovascular: Heart size is normal. Small amount of anterior pericardial fluid and/or thickening, unlikely to be of hemodynamic significance at this time. No associated pericardial calcification. There is aortic atherosclerosis, as well as atherosclerosis of the great vessels of the mediastinum and the coronary arteries, including calcified atherosclerotic plaque in the left main, left anterior descending, left circumflex and right coronary arteries. Thickening and mild calcification of the aortic valve. Aneurysmal dilatation of ascending thoracic aorta (4.8 cm in diameter). Right internal jugular single-lumen porta cath with tip terminating at the superior cavoatrial junction. Mediastinum/Nodes: Multiple borderline enlarged and mildly enlarged mediastinal and bilateral hilar lymph nodes are noted, measuring up to 1.3 cm in the low left paratracheal nodal station, 1 cm in the right hilar nodal station and 1 cm in the subcarinal nodal station. Previously noted left hilar lymphadenopathy has slightly regressed. Esophagus is normal in appearance. No axillary lymphadenopathy. Lungs/Pleura: The previously noted nodule in the superior segment of the left lower lobe is stable in size measuring 11 x 8 mm (axial image 41 of series 4). Previously noted subpleural nodule in the posterior aspect of the right middle lobe abutting the major fissure (axial image 114 of series 4) is smaller than the prior study, measuring only 7 mm on today's examination. No acute consolidative airspace disease. No pleural effusions. Diffuse bronchial wall thickening with mild centrilobular and paraseptal emphysema. The patchy areas of extensive septal thickening with areas of mild peripheral cylindrical bronchiectasis and peripheral bronchiolectasis Musculoskeletal: Numerous predominantly lytic lesions are noted throughout the visualized axial and appendicular skeleton, compatible with widespread metastatic disease to the bones,  including multiple rib lesions bilaterally, several of which demonstrate nondisplaced and minimally displaced pathologic fractures. CT ABDOMEN PELVIS FINDINGS Hepatobiliary: Again noted are innumerable hypovascular lesions scattered throughout the liver. When compared to the prior examination, direct comparison between individual lesions is challenging given the sheer number of lesions, however, the largest lesion seen on the prior study is are slightly larger than the prior examination measuring approximately 3.0 x 3.2 cm on today's study (axial image 64 of series 2) between segments 4A and 4B. No intra or extrahepatic biliary ductal dilatation. Innumerable tiny calcified gallstones lie dependently in the gallbladder. No findings to suggest an acute cholecystitis are noted at this time. Pancreas: No pancreatic mass. No pancreatic ductal dilatation. No pancreatic or peripancreatic fluid or inflammatory changes. Spleen: New hypovascular lesion measuring 2.8 cm in the superior aspect of the spleen, concerning for new metastatic lesion. Adrenals/Urinary Tract: New 1.4 cm intermediate attenuation lesion in the upper pole of the left kidney suspicious for a metastatic lesion (axial image 66 of series 2). Tiny subcentimeter low-attenuation  lesions in the right kidney are too small to definitively characterize, but are similar to the prior study, likely to represent tiny cysts. No hydroureteronephrosis. Urinary bladder is normal in appearance. Stomach/Bowel: The appearance of the stomach is unremarkable. No pathologic dilatation of small bowel or colon. The appendix is not confidently identified and may be surgically absent. Regardless, there are no inflammatory changes noted adjacent to the cecum to suggest the presence of an acute appendicitis at this time. Vascular/Lymphatic: Aortic atherosclerosis with fusiform ectasia of the infrarenal abdominal aorta which measures up to 2.6 cm in diameter. No aneurysm or dissection  noted in the abdominal or pelvic vasculature. No lymphadenopathy identified in the abdomen or pelvis. Reproductive: Prostate gland and seminal vesicles are unremarkable in appearance. Other: No significant volume of ascites.  No pneumoperitoneum. Musculoskeletal: Innumerable predominantly lytic lesions are noted throughout all aspects of the visualized axial and appendicular skeleton, compatible with widespread metastatic disease to the bones. IMPRESSION: 1. Overall, today's examination appears relatively similar to prior studies, with exception of some areas that show mixed response to therapy. Specifically, left hilar lymphadenopathy noted on the prior examination has slightly regressed. However, there is a new hypovascular lesion in the spleen, and a new lesion in the upper pole the left kidney, both of which are concerning for potential new metastatic lesions. Widespread osseous metastatic disease is similar to the prior examination. Left upper lobe pulmonary nodule is unchanged, while the subtle pleural nodule in the posterior aspect of the right middle lobe abutting the major fissure has slightly decreased in size. 2. 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 3. There is also mild diffuse bronchial wall thickening with mild centrilobular and paraseptal emphysema; imaging findings suggestive of underlying COPD. 4. Aneurysmal dilatation of the ascending thoracic aorta (4.8 cm in diameter). Ascending thoracic aortic aneurysm. Recommend semi-annual imaging followup by CTA or MRA and referral to cardiothoracic surgery if not already obtained. This recommendation follows 2010 ACCF/AHA/AATS/ACR/ASA/SCA/SCAI/SIR/STS/SVM Guidelines for the Diagnosis and Management of Patients With Thoracic Aortic Disease. Circulation. 2010; 121: X937-J696. 5. Additional incidental findings, as above. Aortic  Atherosclerosis (ICD10-I70.0) and Emphysema (ICD10-J43.9). Electronically Signed   By: Vinnie Langton M.D.   On: 08/17/2016 13:52   US Abdomen Limited Ruq  Result Date: 09/07/2016 CLINICAL DATA:  Elevated liver function test. Evaluate for biliary obstruction. Known lung cancer with metastases. EXAM: ULTRASOUND ABDOMEN LIMITED RIGHT UPPER QUADRANT COMPARISON:  None. FINDINGS: Gallbladder: Numerous gallstones filling the nondistended gallbladder. No gallbladder wall thickening, pericholecystic fluid or other signs of acute cholecystitis. No sonographic Murphy's sign elicited during the exam, per the sonographer. Common bile duct: Diameter: Normal at 4 mm. Liver: Innumerable small masses throughout the liver, compatible with known metastases, similar appearance to the recent abdomen CT of 08/17/2016 Portal vein is patent on color Doppler imaging with normal direction of blood flow towards the liver. Probable small amount of free fluid in the right upper quadrant adjacent to the liver. IMPRESSION: 1. Cholelithiasis without evidence of acute cholecystitis. Gallbladder appears to be filled with small gallstones. 2. No evidence of biliary obstruction. 3. Diffuse liver metastases. Electronically Signed   By: Franki Cabot M.D.   On: 09/07/2016 10:57     Assessment and plan- Patient is a 66 y.o. 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 He reports that he slept really well last  night. He is weak, but is able to take care of his ADLs.  Liver function tests have progressed.  Thrombocytopenia has progressed.  Total bilirubin 2.1, ALK phos 325, AST 160, ALT 85. Platelets 19,000.   1. RUQ ultrasound today STAT.  2. No Taxotere today.  3. Discuss increasing bilirubin, alkaline phosphatase, and liver enzymes.  LFTs prevent administration of Taxotere (discussed with pharmacy).  Suspect etiology is secondary to liver  replacement.  Discuss RUQ ultrasound today to r/o biliary obstruction (could place stent if obstruction present to improve LFTs).  4.  Discuss progressive thrombocytopenia.  No bleeding.  Dr. Janese Banks felt his thrombocytopenia was secondary to his last chemotherapy of Alimta/Keytruda on 08/10/2016.  Patient denies any new medications or herbal products.  Platelets are 19,000 today.  Possible marrow involvement.  Discuss platelet transfusion if bleeding.  Discussed possible bone marrow aspirate and biopsy next week.  Thrombocytopenia also limiting ability to proceed with chemotherapy.  5. Discuss code status and goals of care today.  Patient has a terminal illness.   Treatment is palliative,  Unable to give chemotherapy today secondary to progressive liver dysfunction and thrombocytopenia.  Patient still thinking about his CODE status and goals of care. He has not made a decision yet.  Recommend DNR/DNI status as unable to reverse underlying disease.  He will discuss further with his daughter.  6. RTC on 09/10/2016 for MD (Dr Janese Banks) assessment and labs (CBC with diff, CMP).    Addendum:  RUQ ultrasound today revealed innumerable small masses throughout the liver and no evidence of biliary obstruction.  Results reviewed with patient and his daughter in the conference room after their return from radiology.  Discuss extremely difficult situation.  Discuss Hospice.  Patient not ready for Hospice.  Patient's daughter requested a Handicapped Parking permit.  Paperwork completed.  Patient will see Dr Janese Banks on 09/10/2016 to discuss further.   Lequita Asal, MD  [facility] 09/07/2016 6:21 PM

## 2016-09-08 LAB — CEA: CEA: 29237 ng/mL — ABNORMAL HIGH (ref 0.0–4.7)

## 2016-09-10 ENCOUNTER — Encounter: Payer: Self-pay | Admitting: Oncology

## 2016-09-10 ENCOUNTER — Inpatient Hospital Stay (HOSPITAL_BASED_OUTPATIENT_CLINIC_OR_DEPARTMENT_OTHER): Payer: Medicare HMO | Admitting: Oncology

## 2016-09-10 ENCOUNTER — Inpatient Hospital Stay: Payer: Medicare HMO

## 2016-09-10 ENCOUNTER — Other Ambulatory Visit: Payer: Medicare HMO

## 2016-09-10 ENCOUNTER — Ambulatory Visit: Payer: Medicare HMO | Admitting: Hematology and Oncology

## 2016-09-10 VITALS — BP 106/76 | HR 99 | Temp 97.8°F | Resp 18 | Wt 148.3 lb

## 2016-09-10 DIAGNOSIS — C3411 Malignant neoplasm of upper lobe, right bronchus or lung: Secondary | ICD-10-CM | POA: Diagnosis not present

## 2016-09-10 DIAGNOSIS — J3801 Paralysis of vocal cords and larynx, unilateral: Secondary | ICD-10-CM | POA: Diagnosis not present

## 2016-09-10 DIAGNOSIS — R948 Abnormal results of function studies of other organs and systems: Secondary | ICD-10-CM | POA: Diagnosis not present

## 2016-09-10 DIAGNOSIS — N289 Disorder of kidney and ureter, unspecified: Secondary | ICD-10-CM

## 2016-09-10 DIAGNOSIS — D6959 Other secondary thrombocytopenia: Secondary | ICD-10-CM

## 2016-09-10 DIAGNOSIS — C778 Secondary and unspecified malignant neoplasm of lymph nodes of multiple regions: Secondary | ICD-10-CM

## 2016-09-10 DIAGNOSIS — Z7189 Other specified counseling: Secondary | ICD-10-CM

## 2016-09-10 DIAGNOSIS — I700 Atherosclerosis of aorta: Secondary | ICD-10-CM

## 2016-09-10 DIAGNOSIS — E119 Type 2 diabetes mellitus without complications: Secondary | ICD-10-CM

## 2016-09-10 DIAGNOSIS — K8020 Calculus of gallbladder without cholecystitis without obstruction: Secondary | ICD-10-CM

## 2016-09-10 DIAGNOSIS — Z9221 Personal history of antineoplastic chemotherapy: Secondary | ICD-10-CM

## 2016-09-10 DIAGNOSIS — C7951 Secondary malignant neoplasm of bone: Secondary | ICD-10-CM | POA: Diagnosis not present

## 2016-09-10 DIAGNOSIS — I712 Thoracic aortic aneurysm, without rupture: Secondary | ICD-10-CM | POA: Diagnosis not present

## 2016-09-10 DIAGNOSIS — F17210 Nicotine dependence, cigarettes, uncomplicated: Secondary | ICD-10-CM | POA: Diagnosis not present

## 2016-09-10 DIAGNOSIS — C787 Secondary malignant neoplasm of liver and intrahepatic bile duct: Secondary | ICD-10-CM

## 2016-09-10 DIAGNOSIS — J439 Emphysema, unspecified: Secondary | ICD-10-CM

## 2016-09-10 DIAGNOSIS — R112 Nausea with vomiting, unspecified: Secondary | ICD-10-CM

## 2016-09-10 DIAGNOSIS — R599 Enlarged lymph nodes, unspecified: Secondary | ICD-10-CM

## 2016-09-10 DIAGNOSIS — R970 Elevated carcinoembryonic antigen [CEA]: Secondary | ICD-10-CM | POA: Diagnosis not present

## 2016-09-10 DIAGNOSIS — R634 Abnormal weight loss: Secondary | ICD-10-CM

## 2016-09-10 DIAGNOSIS — C3490 Malignant neoplasm of unspecified part of unspecified bronchus or lung: Secondary | ICD-10-CM

## 2016-09-10 DIAGNOSIS — Z7984 Long term (current) use of oral hypoglycemic drugs: Secondary | ICD-10-CM

## 2016-09-10 DIAGNOSIS — R945 Abnormal results of liver function studies: Secondary | ICD-10-CM

## 2016-09-10 DIAGNOSIS — D7389 Other diseases of spleen: Secondary | ICD-10-CM

## 2016-09-10 DIAGNOSIS — Z79899 Other long term (current) drug therapy: Secondary | ICD-10-CM | POA: Diagnosis not present

## 2016-09-10 DIAGNOSIS — R630 Anorexia: Secondary | ICD-10-CM

## 2016-09-10 DIAGNOSIS — D696 Thrombocytopenia, unspecified: Secondary | ICD-10-CM

## 2016-09-10 DIAGNOSIS — Z66 Do not resuscitate: Secondary | ICD-10-CM

## 2016-09-10 DIAGNOSIS — I2510 Atherosclerotic heart disease of native coronary artery without angina pectoris: Secondary | ICD-10-CM

## 2016-09-10 DIAGNOSIS — R7989 Other specified abnormal findings of blood chemistry: Secondary | ICD-10-CM

## 2016-09-10 LAB — COMPREHENSIVE METABOLIC PANEL
ALT: 76 U/L — ABNORMAL HIGH (ref 17–63)
AST: 125 U/L — ABNORMAL HIGH (ref 15–41)
Albumin: 2.8 g/dL — ABNORMAL LOW (ref 3.5–5.0)
Alkaline Phosphatase: 349 U/L — ABNORMAL HIGH (ref 38–126)
Anion gap: 8 (ref 5–15)
BUN: 14 mg/dL (ref 6–20)
CO2: 25 mmol/L (ref 22–32)
Calcium: 8.1 mg/dL — ABNORMAL LOW (ref 8.9–10.3)
Chloride: 97 mmol/L — ABNORMAL LOW (ref 101–111)
Creatinine, Ser: 0.7 mg/dL (ref 0.61–1.24)
GFR calc Af Amer: >60 mL/min (ref >60)
GFR calc non Af Amer: >60 mL/min (ref >60)
Glucose, Bld: 90 mg/dL (ref 65–99)
Potassium: 4.7 mmol/L (ref 3.5–5.1)
Sodium: 130 mmol/L — ABNORMAL LOW (ref 135–145)
Total Bilirubin: 2.8 mg/dL — ABNORMAL HIGH (ref 0.3–1.2)
Total Protein: 6.1 g/dL — ABNORMAL LOW (ref 6.5–8.1)

## 2016-09-10 LAB — CBC WITH DIFFERENTIAL/PLATELET
Basophils Absolute: 0.1 10*3/uL (ref 0–0.1)
Basophils Relative: 1 %
Eosinophils Absolute: 0 10*3/uL (ref 0–0.7)
Eosinophils Relative: 0 %
HCT: 36.4 % — ABNORMAL LOW (ref 40.0–52.0)
Hemoglobin: 12.4 g/dL — ABNORMAL LOW (ref 13.0–18.0)
Lymphocytes Relative: 16 %
Lymphs Abs: 1.6 10*3/uL (ref 1.0–3.6)
MCH: 36.3 pg — ABNORMAL HIGH (ref 26.0–34.0)
MCHC: 33.9 g/dL (ref 32.0–36.0)
MCV: 107 fL — ABNORMAL HIGH (ref 80.0–100.0)
Monocytes Absolute: 1.6 10*3/uL — ABNORMAL HIGH (ref 0.2–1.0)
Monocytes Relative: 16 %
Neutro Abs: 6.6 10*3/uL — ABNORMAL HIGH (ref 1.4–6.5)
Neutrophils Relative %: 67 %
Platelets: 17 10*3/uL — CL (ref 150–440)
RBC: 3.4 MIL/uL — ABNORMAL LOW (ref 4.40–5.90)
RDW: 18.1 % — ABNORMAL HIGH (ref 11.5–14.5)
WBC: 9.9 10*3/uL (ref 3.8–10.6)

## 2016-09-10 MED ORDER — HEPARIN SOD (PORK) LOCK FLUSH 100 UNIT/ML IV SOLN
INTRAVENOUS | Status: AC
  Filled 2016-09-10: qty 5

## 2016-09-10 MED ORDER — SODIUM CHLORIDE 0.9% FLUSH
10.0000 mL | Freq: Once | INTRAVENOUS | Status: AC
  Administered 2016-09-10: 10 mL via INTRAVENOUS
  Filled 2016-09-10: qty 10

## 2016-09-10 MED ORDER — HEPARIN SOD (PORK) LOCK FLUSH 100 UNIT/ML IV SOLN
500.0000 [IU] | Freq: Once | INTRAVENOUS | Status: AC
  Administered 2016-09-10: 500 [IU] via INTRAVENOUS

## 2016-09-10 MED ORDER — SODIUM CHLORIDE 0.9 % IV SOLN
INTRAVENOUS | Status: AC
  Administered 2016-09-10: 11:00:00 via INTRAVENOUS
  Filled 2016-09-10: qty 1000

## 2016-09-10 NOTE — Progress Notes (Signed)
Hematology/Oncology Consult note [facility]  Telephone:([phone] [phone]  Patient Care Team: Gennette Pac, [place] as PCP - General (Family Medicine)   Name of the patient: Austin Bryan  [id number]  10-29-1950   Date of visit: 09/10/16  Diagnosis- Stage IV adenocarcinoma of the lung  Chief complaint/ Reason for visit- discuss goals of care  Heme/Onc history: 1. Patient is a 66 year old male who was seen by Dr. walk from ENT for symptoms of hoarseness of voice for 1 month. 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 Patient is a chronic smoker and has smoked about 1-1/2 packs per day for more than 30 years. He has lost about 17 pounds of weight over the last 6-7 months. Patient lives with his sister and is independent of hisADLs and IADLs.  2. CT soft tissue of the neck on 04/02/2016 showed: 1. Soft tissue mass along the undersurface of the aortic arch is most concerning for a metastatic adenopathy. This likely impacts the left recurrent laryngeal nerve leading to vocal cord paralysis. 2. Extensive mediastinal adenopathy compatible with metastatic disease. 3. Numerous lytic lesions throughout the visualized is cervical and thoracic spine as well is bilateral ribs. These are compatible with additional metastases. 4. 12 mm pleural-based nodule in the left upper lid may represent a primary bronchogenic neoplasm. This is more likely metastatic disease. 5. Findings are most concerning for a primary lung cancer. Recommend CT of the chest, abdomen, and pelvis with contrast for further evaluation of the primary neoplasm and extent of metastatic disease.  3. PET CT scan on 04/10/2016 showed:IMPRESSION:1. Extensive lytic malignancy in the skeleton with heavy burden of hepatic metastatic lesions, adenopathy in the chest, lower neck, and likely porta hepatis compatible with malignancy ; and a small left upper lobe pulmonary nodule which is likewise  hypermetabolic. The bony disease resembles myeloma but it would be unusual to have this much hepatic and soft tissue involvement in the setting of myeloma. Other possibilities might include an unusually aggressive central lung cancer. Tissue diagnosis recommended. 2. AP window mass is indistinct and may be interfering with the recurrent laryngeal nerve. The asymmetric appearance of the cords is probably secondary to this interference. 3. 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 Cholelithiasis.  4. US guided liver biopsy showed: DIAGNOSIS:  A. LIVER MASS, LEFT LOBE; ULTRASOUND GUIDED BIOPSY:  - METASTATIC NON-SMALL CELL CARCINOMA, TTF-1 POSITIVE.   Comment:  A limited panel of immunohistochemical stains was performed. The  metastatic malignant neoplasm demonstrates the following pattern of  immunoreactivity:  Super pancytokeratin: Positive  TTF-1: Positive  CD56: Negative  CDX-2: Negative  Stain controls worked appropriately. The morphologic findings are  consistent with metastatic poorly differentiated carcinoma. The presence  of TTF-1 positivity is compatible with lung origin.   5. Cycle 1 carbo/alimta given on 04/26/16. He gets monthly X geva. CEA elevated at 7641 at baseline. keytruda added to cycle 2  6. Foundation one testing showed no EGFR, ALK, ROS and BRAF mutations. PDL1 expression was 70%. No other actionable mutations  7. Interim scans after 4 cycles of chemo/immunotherapy showed: Marland Kitchen Mixed response to therapy. Specifically, although there has been slight regression of previously noted left upper lobe pulmonary nodule, extensive mediastinal and left hilar lymphadenopathy appears slightly progressive and there is been enlargement over right middle lobe pulmonary nodule. Previously noted widespread metastatic disease to the liver and bones  appear similar to the prior Examination  8. CEA continued to rise after 2 cycles of maintenance alimta/ [place]. Repeat scans again showed mixed response but new kidney lesion and possible splenic lesion. He was sent to Dr. Georgeanna Lea at [facility] for second opinion. He was not considered fit for any clinical trial. Second line docetaxel recommended if feasible. Patient continued to have worsening LFT's, rising bilirubin and worsening thrombocytopenia. Seen by Dr. Mike Gip last week who recommended hospice    Interval history- overall he is doing poorly. He feels fatigued. He is only drinking tomato soup. Not able to eat or drink anything else. He lives with his sister and he ahsbeen the caregiver for her. His daughter lives about 30 min away. Denies any pain presently. Has nausea on and off. Denies any bleeding  ECOG PS- 3 Pain scale- 0 Opioid associated constipation- no  Review of systems- Review of Systems  Constitutional: Positive for malaise/fatigue.  Gastrointestinal: Positive for nausea and vomiting.  Neurological: Positive for weakness.      No Known Allergies   Past Medical History:  Diagnosis Date  . Allergy   . Diabetes mellitus without complication ([place])   . Dyspnea   . Lung cancer ([place]) 03/2016   Chemo tx's.     Past Surgical History:  Procedure Laterality Date  . ADENOIDECTOMY    . COLONOSCOPY    . IR FLUORO GUIDE PORT INSERTION RIGHT  04/16/2016  . TONSILLECTOMY      Social History   Social History  . Marital status: Divorced    Spouse name: N/A  . Number of children: N/A  . Years of education: N/A   Occupational History  . Not on file.   Social History Main Topics  . Smoking status: Current Every Day Smoker    Packs/day: 1.00    Years: 40.00    Types: Cigarettes  . Smokeless tobacco: Never Used  . Alcohol use No  . Drug use: No  . Sexual activity: Not on file   Other Topics Concern  . Not on file   Social History Narrative  . No narrative on file     Family History  Problem Relation Age of Onset  . Cancer Mother   . Cancer Father      Current Outpatient Prescriptions:  .  fexofenadine (ALLEGRA) 180 MG tablet, Take 1 tablet (180 mg total) by mouth daily., Disp: 90 tablet, Rfl: 2 .  fluticasone (FLONASE) 50 MCG/ACT nasal spray, Place 2 sprays into both nostrils daily., Disp: , Rfl:  .  metFORMIN (GLUCOPHAGE) 500 MG tablet, Take 1 tablet (500 mg total) by mouth 2 (two) times daily with a meal., Disp: 180 tablet, Rfl: 2 .  morphine (MS CONTIN) 15 MG 12 hr tablet, Take 1 tablet (15 mg total) by mouth every 12 (twelve) hours., Disp: 60 tablet, Rfl: 0 .  OLANZapine (ZYPREXA) 10 MG tablet, Take 1 tablet (10 mg total) by mouth at bedtime., Disp: 30 tablet, Rfl: 2 .  ranitidine (ZANTAC) 150 MG tablet, Take 1 tablet (150 mg total) by mouth 2 (two) times daily., Disp: 180 tablet, Rfl: 2 .  chlorproMAZINE (THORAZINE) 25 MG tablet, Take 1 tablet (25 mg total) by mouth every 6 (six) hours as needed. (Patient not taking: Reported on 09/10/2016), Disp: 30 tablet, Rfl: 0 .  dexamethasone (DECADRON) 4 MG tablet, Take 2 tabs two times a day the day before and the day after  chemo (Patient not taking: Reported on 09/10/2016), Disp: 30 tablet, Rfl: 1 .  lidocaine (LIDODERM) 5 %, Place 1 patch onto the skin daily. Remove & Discard patch within 12 hours or as directed by MD (Patient not taking: Reported on 08/23/2016), Disp: 30 patch, Rfl: 0 .  Morphine Sulfate (MORPHINE CONCENTRATE) 10 mg / 0.5 ml concentrated solution, Take by mouth every 4 (four) hours as needed for severe pain. 0.25 ml every 4 hours as needed (prescribed by Dr. Lew Dawes), Disp: , Rfl:  .  prochlorperazine (COMPAZINE) 10 MG tablet, Take 1 tablet (10 mg total) by mouth every 6 (six) hours as needed (Nausea or vomiting). (Patient not taking: Reported on 09/10/2016), Disp: 30 tablet, Rfl: 1 .  senna (SENOKOT) 8.6 MG TABS tablet, Take 2 tablets (17.2 mg total) by mouth at bedtime. (Patient not  taking: Reported on 09/10/2016), Disp: 120 each, Rfl: 0  Current Facility-Administered Medications:  .  0.9 %  sodium chloride infusion, , Intravenous, Continuous, Sindy Guadeloupe, MD, Last Rate: 999 mL/hr at 09/10/16 1115  Facility-Administered Medications Ordered in Other Visits:  .  heparin lock flush 100 unit/mL, 500 Units, Intravenous, Once, Corcoran, Melissa C, MD .  heparin lock flush 100 unit/mL, 500 Units, Intravenous, Once, Sindy Guadeloupe, MD  Physical exam:  Vitals:   09/10/16 1039  BP: 106/76  Pulse: 99  Resp: 18  Temp: 97.8 F (36.6 C)  TempSrc: Tympanic  Weight: 148 lb 4.2 oz (67.3 kg)   Physical Exam  Constitutional: He is oriented to person, place, and time.  Appears fatigued. Sitting ina  wheelchair  HENT:  Head: Normocephalic and atraumatic.  Mild scleral icterus  Eyes: Pupils are equal, round, and reactive to light. EOM are normal.  Neck: Normal range of motion.  Cardiovascular: Normal rate, regular rhythm and normal heart sounds.   Pulmonary/Chest: Effort normal and breath sounds normal.  Abdominal: Soft. Bowel sounds are normal.  ttp in RUQ  Neurological: He is alert and oriented to person, place, and time.  Skin: Skin is warm and dry.     CMP Latest Ref Rng & Units 09/10/2016  Glucose 65 - 99 mg/dL 90  BUN 6 - 20 mg/dL 14  Creatinine 0.61 - 1.24 mg/dL 0.70  Sodium 135 - 145 mmol/L 130(L)  Potassium 3.5 - 5.1 mmol/L 4.7  Chloride 101 - 111 mmol/L 97(L)  CO2 22 - 32 mmol/L 25  Calcium 8.9 - 10.3 mg/dL 8.1(L)  Total Protein 6.5 - 8.1 g/dL 6.1(L)  Total Bilirubin 0.3 - 1.2 mg/dL 2.8(H)  Alkaline Phos 38 - 126 U/L 349(H)  AST 15 - 41 U/L 125(H)  ALT 17 - 63 U/L 76(H)   CBC Latest Ref Rng & Units 09/10/2016  WBC 3.8 - 10.6 K/uL 9.9  Hemoglobin 13.0 - 18.0 g/dL 12.4(L)  Hematocrit 40.0 - 52.0 % 36.4(L)  Platelets 150 - 440 K/uL 17(LL)    No images are attached to the encounter.  Ct Chest W Contrast  Result Date: 08/17/2016 CLINICAL DATA:   66 year old male with history of lung cancer status post chemotherapy. Recent history of nausea, vomiting, diarrhea, shortness of breath, chest pain and generalize abdominal pain for the past several weeks. EXAM: CT CHEST, ABDOMEN, AND PELVIS WITH CONTRAST TECHNIQUE: Multidetector CT imaging of the chest, abdomen and pelvis was performed following the standard protocol during bolus administration of intravenous contrast. CONTRAST:  120m ISOVUE-300 IOPAMIDOL (ISOVUE-300) INJECTION 61% COMPARISON:  CT the abdomen and pelvis 07/13/2016. PET-CT 04/10/2016. FINDINGS: CT CHEST FINDINGS Cardiovascular: Heart size is normal. Small amount of anterior pericardial fluid and/or thickening,  unlikely to be of hemodynamic significance at this time. No associated pericardial calcification. There is aortic atherosclerosis, as well as atherosclerosis of the great vessels of the mediastinum and the coronary arteries, including calcified atherosclerotic plaque in the left main, left anterior descending, left circumflex and right coronary arteries. Thickening and mild calcification of the aortic valve. Aneurysmal dilatation of ascending thoracic aorta (4.8 cm in diameter). Right internal jugular single-lumen porta cath with tip terminating at the superior cavoatrial junction. Mediastinum/Nodes: Multiple borderline enlarged and mildly enlarged mediastinal and bilateral hilar lymph nodes are noted, measuring up to 1.3 cm in the low left paratracheal nodal station, 1 cm in the right hilar nodal station and 1 cm in the subcarinal nodal station. Previously noted left hilar lymphadenopathy has slightly regressed. Esophagus is normal in appearance. No axillary lymphadenopathy. Lungs/Pleura: The previously noted nodule in the superior segment of the left lower lobe is stable in size measuring 11 x 8 mm (axial image 41 of series 4). Previously noted subpleural nodule in the posterior aspect of the right middle lobe abutting the major fissure  (axial image 114 of series 4) is smaller than the prior study, measuring only 7 mm on today's examination. No acute consolidative airspace disease. No pleural effusions. Diffuse bronchial wall thickening with mild centrilobular and paraseptal emphysema. The patchy areas of extensive septal thickening with areas of mild peripheral cylindrical bronchiectasis and peripheral bronchiolectasis Musculoskeletal: Numerous predominantly lytic lesions are noted throughout the visualized axial and appendicular skeleton, compatible with widespread metastatic disease to the bones, including multiple rib lesions bilaterally, several of which demonstrate nondisplaced and minimally displaced pathologic fractures. CT ABDOMEN PELVIS FINDINGS Hepatobiliary: Again noted are innumerable hypovascular lesions scattered throughout the liver. When compared to the prior examination, direct comparison between individual lesions is challenging given the sheer number of lesions, however, the largest lesion seen on the prior study is are slightly larger than the prior examination measuring approximately 3.0 x 3.2 cm on today's study (axial image 64 of series 2) between segments 4A and 4B. No intra or extrahepatic biliary ductal dilatation. Innumerable tiny calcified gallstones lie dependently in the gallbladder. No findings to suggest an acute cholecystitis are noted at this time. Pancreas: No pancreatic mass. No pancreatic ductal dilatation. No pancreatic or peripancreatic fluid or inflammatory changes. Spleen: New hypovascular lesion measuring 2.8 cm in the superior aspect of the spleen, concerning for new metastatic lesion. Adrenals/Urinary Tract: New 1.4 cm intermediate attenuation lesion in the upper pole of the left kidney suspicious for a metastatic lesion (axial image 66 of series 2). Tiny subcentimeter low-attenuation lesions in the right kidney are too small to definitively characterize, but are similar to the prior study, likely to  represent tiny cysts. No hydroureteronephrosis. Urinary bladder is normal in appearance. Stomach/Bowel: The appearance of the stomach is unremarkable. No pathologic dilatation of small bowel or colon. The appendix is not confidently identified and may be surgically absent. Regardless, there are no inflammatory changes noted adjacent to the cecum to suggest the presence of an acute appendicitis at this time. Vascular/Lymphatic: Aortic atherosclerosis with fusiform ectasia of the infrarenal abdominal aorta which measures up to 2.6 cm in diameter. No aneurysm or dissection noted in the abdominal or pelvic vasculature. No lymphadenopathy identified in the abdomen or pelvis. Reproductive: Prostate gland and seminal vesicles are unremarkable in appearance. Other: No significant volume of ascites.  No pneumoperitoneum. Musculoskeletal: Innumerable predominantly lytic lesions are noted throughout all aspects of the visualized axial and appendicular skeleton, compatible with widespread  metastatic disease to the bones. IMPRESSION: 1. Overall, today's examination appears relatively similar to prior studies, with exception of some areas that show mixed response to therapy. Specifically, left hilar lymphadenopathy noted on the prior examination has slightly regressed. However, there is a new hypovascular lesion in the spleen, and a new lesion in the upper pole the left kidney, both of which are concerning for potential new metastatic lesions. Widespread osseous metastatic disease is similar to the prior examination. Left upper lobe pulmonary nodule is unchanged, while the subtle pleural nodule in the posterior aspect of the right middle lobe abutting the major fissure has slightly decreased in size. 2. 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 3. There is also mild diffuse bronchial wall thickening with mild  centrilobular and paraseptal emphysema; imaging findings suggestive of underlying COPD. 4. Aneurysmal dilatation of the ascending thoracic aorta (4.8 cm in diameter). Ascending thoracic aortic aneurysm. Recommend semi-annual imaging followup by CTA or MRA and referral to cardiothoracic surgery if not already obtained. This recommendation follows 2010 ACCF/AHA/AATS/ACR/ASA/SCA/SCAI/SIR/STS/SVM Guidelines for the Diagnosis and Management of Patients With Thoracic Aortic Disease. Circulation. 2010; 121: W295-A213. 5. Additional incidental findings, as above. Aortic Atherosclerosis (ICD10-I70.0) and Emphysema (ICD10-J43.9). Electronically Signed   By: Vinnie Langton M.D.   On: 08/17/2016 13:52   Nm Bone Scan Whole Body  Result Date: 08/17/2016 CLINICAL DATA:  66 year old male with a history of metastatic lung cancer. EXAM: NUCLEAR MEDICINE WHOLE BODY BONE SCAN TECHNIQUE: Whole body anterior and posterior images were obtained approximately 3 hours after intravenous injection of radiopharmaceutical. RADIOPHARMACEUTICALS:  22.3 mCi Technetium-63mMDP IV COMPARISON:  Recent prior bone scan 07/17/2016 ; prior CT scan of the chest abdomen and pelvis 07/13/2016; CT scan of the chest abdomen and pelvis obtained today 08/17/2016 FINDINGS: Multiple punctate foci of increased radiotracer uptake noted throughout the ribs, particularly the right second fifth and sixth ribs, and the left eleventh rib. Comparing to today's CT scan, the amount of increased radiotracer activity is significantly less than the volume of diffuse lytic metastatic disease. IMPRESSION: 1. Similar to the prior nuclear medicine bone scan, the degree of disease burden is significantly less than would be expected compared to the prior PET-CT from 04/10/2016. This may be secondary to many of these lesions being lytic in nature which may not show up by bone scan, versus interval response to therapy. 2. Persistent foci of increased uptake in the bilateral ribs  corresponds with areas of relatively sclerotic metastatic disease on the accompanying CT scan. Electronically Signed   By: HJacqulynn CadetM.D.   On: 08/17/2016 13:10   Ct Abdomen Pelvis W Contrast  Result Date: 08/17/2016 CLINICAL DATA:  66year old male with history of lung cancer status post chemotherapy. Recent history of nausea, vomiting, diarrhea, shortness of breath, chest pain and generalize abdominal pain for the past several weeks. EXAM: CT CHEST, ABDOMEN, AND PELVIS WITH CONTRAST TECHNIQUE: Multidetector CT imaging of the chest, abdomen and pelvis was performed following the standard protocol during bolus administration of intravenous contrast. CONTRAST:  1069mISOVUE-300 IOPAMIDOL (ISOVUE-300) INJECTION 61% COMPARISON:  CT the abdomen and pelvis 07/13/2016. PET-CT 04/10/2016. FINDINGS: CT CHEST FINDINGS Cardiovascular: Heart size is normal. Small amount of anterior pericardial fluid and/or thickening, unlikely to be of hemodynamic significance at this time. No associated pericardial calcification. There is aortic atherosclerosis, as well as atherosclerosis of the great vessels of the mediastinum and the coronary arteries, including calcified  atherosclerotic plaque in the left main, left anterior descending, left circumflex and right coronary arteries. Thickening and mild calcification of the aortic valve. Aneurysmal dilatation of ascending thoracic aorta (4.8 cm in diameter). Right internal jugular single-lumen porta cath with tip terminating at the superior cavoatrial junction. Mediastinum/Nodes: Multiple borderline enlarged and mildly enlarged mediastinal and bilateral hilar lymph nodes are noted, measuring up to 1.3 cm in the low left paratracheal nodal station, 1 cm in the right hilar nodal station and 1 cm in the subcarinal nodal station. Previously noted left hilar lymphadenopathy has slightly regressed. Esophagus is normal in appearance. No axillary lymphadenopathy. Lungs/Pleura: The  previously noted nodule in the superior segment of the left lower lobe is stable in size measuring 11 x 8 mm (axial image 41 of series 4). Previously noted subpleural nodule in the posterior aspect of the right middle lobe abutting the major fissure (axial image 114 of series 4) is smaller than the prior study, measuring only 7 mm on today's examination. No acute consolidative airspace disease. No pleural effusions. Diffuse bronchial wall thickening with mild centrilobular and paraseptal emphysema. The patchy areas of extensive septal thickening with areas of mild peripheral cylindrical bronchiectasis and peripheral bronchiolectasis Musculoskeletal: Numerous predominantly lytic lesions are noted throughout the visualized axial and appendicular skeleton, compatible with widespread metastatic disease to the bones, including multiple rib lesions bilaterally, several of which demonstrate nondisplaced and minimally displaced pathologic fractures. CT ABDOMEN PELVIS FINDINGS Hepatobiliary: Again noted are innumerable hypovascular lesions scattered throughout the liver. When compared to the prior examination, direct comparison between individual lesions is challenging given the sheer number of lesions, however, the largest lesion seen on the prior study is are slightly larger than the prior examination measuring approximately 3.0 x 3.2 cm on today's study (axial image 64 of series 2) between segments 4A and 4B. No intra or extrahepatic biliary ductal dilatation. Innumerable tiny calcified gallstones lie dependently in the gallbladder. No findings to suggest an acute cholecystitis are noted at this time. Pancreas: No pancreatic mass. No pancreatic ductal dilatation. No pancreatic or peripancreatic fluid or inflammatory changes. Spleen: New hypovascular lesion measuring 2.8 cm in the superior aspect of the spleen, concerning for new metastatic lesion. Adrenals/Urinary Tract: New 1.4 cm intermediate attenuation lesion in the  upper pole of the left kidney suspicious for a metastatic lesion (axial image 66 of series 2). Tiny subcentimeter low-attenuation lesions in the right kidney are too small to definitively characterize, but are similar to the prior study, likely to represent tiny cysts. No hydroureteronephrosis. Urinary bladder is normal in appearance. Stomach/Bowel: The appearance of the stomach is unremarkable. No pathologic dilatation of small bowel or colon. The appendix is not confidently identified and may be surgically absent. Regardless, there are no inflammatory changes noted adjacent to the cecum to suggest the presence of an acute appendicitis at this time. Vascular/Lymphatic: Aortic atherosclerosis with fusiform ectasia of the infrarenal abdominal aorta which measures up to 2.6 cm in diameter. No aneurysm or dissection noted in the abdominal or pelvic vasculature. No lymphadenopathy identified in the abdomen or pelvis. Reproductive: Prostate gland and seminal vesicles are unremarkable in appearance. Other: No significant volume of ascites.  No pneumoperitoneum. Musculoskeletal: Innumerable predominantly lytic lesions are noted throughout all aspects of the visualized axial and appendicular skeleton, compatible with widespread metastatic disease to the bones. IMPRESSION: 1. Overall, today's examination appears relatively similar to prior studies, with exception of some areas that show mixed response to therapy. Specifically, left hilar lymphadenopathy noted on the  prior examination has slightly regressed. However, there is a new hypovascular lesion in the spleen, and a new lesion in the upper pole the left kidney, both of which are concerning for potential new metastatic lesions. Widespread osseous metastatic disease is similar to the prior examination. Left upper lobe pulmonary nodule is unchanged, while the subtle pleural nodule in the posterior aspect of the right middle lobe abutting the major fissure has slightly  decreased in size. 2. 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 3. There is also mild diffuse bronchial wall thickening with mild centrilobular and paraseptal emphysema; imaging findings suggestive of underlying COPD. 4. Aneurysmal dilatation of the ascending thoracic aorta (4.8 cm in diameter). Ascending thoracic aortic aneurysm. Recommend semi-annual imaging followup by CTA or MRA and referral to cardiothoracic surgery if not already obtained. This recommendation follows 2010 ACCF/AHA/AATS/ACR/ASA/SCA/SCAI/SIR/STS/SVM Guidelines for the Diagnosis and Management of Patients With Thoracic Aortic Disease. Circulation. 2010; 121: C585-I778. 5. Additional incidental findings, as above. Aortic Atherosclerosis (ICD10-I70.0) and Emphysema (ICD10-J43.9). Electronically Signed   By: Vinnie Langton M.D.   On: 08/17/2016 13:52   US Abdomen Limited Ruq  Result Date: 09/07/2016 CLINICAL DATA:  Elevated liver function test. Evaluate for biliary obstruction. Known lung cancer with metastases. EXAM: ULTRASOUND ABDOMEN LIMITED RIGHT UPPER QUADRANT COMPARISON:  None. FINDINGS: Gallbladder: Numerous gallstones filling the nondistended gallbladder. No gallbladder wall thickening, pericholecystic fluid or other signs of acute cholecystitis. No sonographic Murphy's sign elicited during the exam, per the sonographer. Common bile duct: Diameter: Normal at 4 mm. Liver: Innumerable small masses throughout the liver, compatible with known metastases, similar appearance to the recent abdomen CT of 08/17/2016 Portal vein is patent on color Doppler imaging with normal direction of blood flow towards the liver. Probable small amount of free fluid in the right upper quadrant adjacent to the liver. IMPRESSION: 1. Cholelithiasis without evidence of acute cholecystitis. Gallbladder appears to be filled with small gallstones.  2. No evidence of biliary obstruction. 3. Diffuse liver metastases. Electronically Signed   By: Franki Cabot M.D.   On: 09/07/2016 10:57     Assessment and plan- Patient is a 66 y.o. 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 His performance status is declining, oral intake is poor. CEA continues to increase exponentially. CBC shows significant thrombocytopenia with mixed rbc population (possibly nucleated rbcs suggestive of bone marrow involvement). He also has worseing AST, ALT and bilirubin. He is not a candidate for second line docetaxel at this point due to above reasons. Discussed that his prognosis is very poor and likely in weeks. He has still not made up his mind regarding his CODE status. I do recommend that DNR/DNI would be in his best interest. Discussed hospice as a reasonable option at this time since he is nto a candidate for any further therapy. That hospice will focus on his comfort and symptoms and that would mean no further blood draws, doctors visits or recurrent hospitalizations. He is open to considering it but does not have 24 hour caregiver. Daughter will look into moving in with him or bringing him to her place  I will refer him to hospice today. Give him 1L NS.   We will make a follow up phone call after he meets hospice and decide about further follow up at that time   Visit Diagnosis 1. Primary malignant neoplasm  of lung metastatic to other site, unspecified laterality ([place])   2. Goals of care, counseling/discussion      Dr. Randa Evens, MD, MPH [facility] at [facility] Pager- [phone] 09/10/2016 12:07 PM

## 2016-09-10 NOTE — Progress Notes (Signed)
Here for follow up. Stated intermittent hopelessness,denies self harm ideation. Pt and daughter will consider counseling offered thru [facility] and let us know.

## 2016-09-11 ENCOUNTER — Ambulatory Visit: Payer: Medicare HMO

## 2016-09-13 ENCOUNTER — Telehealth: Payer: Self-pay | Admitting: *Deleted

## 2016-09-13 NOTE — Telephone Encounter (Signed)
Called Austin Bryan  About how did Hospice coming to house if pt has chose to accept it and if he had made a decision about full code or no code. Will await to hear response.

## 2016-09-14 ENCOUNTER — Other Ambulatory Visit: Payer: Medicare HMO

## 2016-09-14 ENCOUNTER — Ambulatory Visit: Payer: Medicare HMO

## 2016-09-19 ENCOUNTER — Other Ambulatory Visit: Payer: Self-pay

## 2016-09-19 ENCOUNTER — Observation Stay
Admission: EM | Admit: 2016-09-19 | Discharge: 2016-09-19 | DRG: 181 | Disposition: A | Discharge status: Hospice | Payer: Medicare HMO | Attending: Internal Medicine | Admitting: Internal Medicine

## 2016-09-19 ENCOUNTER — Encounter: Payer: Self-pay | Admitting: Emergency Medicine

## 2016-09-19 ENCOUNTER — Emergency Department: Payer: Medicare HMO

## 2016-09-19 DIAGNOSIS — F17210 Nicotine dependence, cigarettes, uncomplicated: Secondary | ICD-10-CM | POA: Diagnosis present

## 2016-09-19 DIAGNOSIS — E871 Hypo-osmolality and hyponatremia: Secondary | ICD-10-CM | POA: Diagnosis present

## 2016-09-19 DIAGNOSIS — C3490 Malignant neoplasm of unspecified part of unspecified bronchus or lung: Principal | ICD-10-CM | POA: Diagnosis present

## 2016-09-19 DIAGNOSIS — C787 Secondary malignant neoplasm of liver and intrahepatic bile duct: Secondary | ICD-10-CM | POA: Diagnosis present

## 2016-09-19 DIAGNOSIS — W19XXXA Unspecified fall, initial encounter: Secondary | ICD-10-CM

## 2016-09-19 DIAGNOSIS — Z66 Do not resuscitate: Secondary | ICD-10-CM | POA: Diagnosis present

## 2016-09-19 DIAGNOSIS — Z79899 Other long term (current) drug therapy: Secondary | ICD-10-CM | POA: Diagnosis not present

## 2016-09-19 DIAGNOSIS — C7951 Secondary malignant neoplasm of bone: Secondary | ICD-10-CM | POA: Diagnosis not present

## 2016-09-19 DIAGNOSIS — D696 Thrombocytopenia, unspecified: Secondary | ICD-10-CM | POA: Diagnosis not present

## 2016-09-19 DIAGNOSIS — W010XXA Fall on same level from slipping, tripping and stumbling without subsequent striking against object, initial encounter: Secondary | ICD-10-CM | POA: Diagnosis present

## 2016-09-19 DIAGNOSIS — Z7984 Long term (current) use of oral hypoglycemic drugs: Secondary | ICD-10-CM

## 2016-09-19 DIAGNOSIS — K7290 Hepatic failure, unspecified without coma: Secondary | ICD-10-CM | POA: Diagnosis present

## 2016-09-19 DIAGNOSIS — E860 Dehydration: Secondary | ICD-10-CM | POA: Diagnosis present

## 2016-09-19 DIAGNOSIS — Z9221 Personal history of antineoplastic chemotherapy: Secondary | ICD-10-CM

## 2016-09-19 DIAGNOSIS — E119 Type 2 diabetes mellitus without complications: Secondary | ICD-10-CM | POA: Diagnosis not present

## 2016-09-19 DIAGNOSIS — E7220 Disorder of urea cycle metabolism, unspecified: Secondary | ICD-10-CM | POA: Diagnosis not present

## 2016-09-19 DIAGNOSIS — Z515 Encounter for palliative care: Secondary | ICD-10-CM | POA: Diagnosis present

## 2016-09-19 DIAGNOSIS — R17 Unspecified jaundice: Secondary | ICD-10-CM

## 2016-09-19 DIAGNOSIS — K7682 Hepatic encephalopathy: Secondary | ICD-10-CM

## 2016-09-19 DIAGNOSIS — R945 Abnormal results of liver function studies: Secondary | ICD-10-CM | POA: Diagnosis not present

## 2016-09-19 DIAGNOSIS — S0990XA Unspecified injury of head, initial encounter: Secondary | ICD-10-CM | POA: Diagnosis not present

## 2016-09-19 LAB — CBC WITH DIFFERENTIAL/PLATELET
BASOS ABS: 0 10*3/uL (ref 0–0.1)
Band Neutrophils: 0 %
Basophils Relative: 0 %
Blasts: 0 %
EOS PCT: 0 %
Eosinophils Absolute: 0 10*3/uL (ref 0–0.7)
HCT: 34.7 % — ABNORMAL LOW (ref 40.0–52.0)
HEMOGLOBIN: 11.9 g/dL — AB (ref 13.0–18.0)
LYMPHS ABS: 0.8 10*3/uL — AB (ref 1.0–3.6)
Lymphocytes Relative: 5 %
MCH: 36.7 pg — ABNORMAL HIGH (ref 26.0–34.0)
MCHC: 34.3 g/dL (ref 32.0–36.0)
MCV: 107.1 fL — AB (ref 80.0–100.0)
METAMYELOCYTES PCT: 0 %
MONO ABS: 1.2 10*3/uL — AB (ref 0.2–1.0)
MYELOCYTES: 0 %
Monocytes Relative: 7 %
NEUTROS PCT: 88 %
Neutro Abs: 14.9 10*3/uL — ABNORMAL HIGH (ref 1.4–6.5)
Other: 0 %
PLATELETS: 10 10*3/uL — AB (ref 150–440)
PROMYELOCYTES ABS: 0 %
RBC: 3.24 MIL/uL — AB (ref 4.40–5.90)
RDW: 18.2 % — ABNORMAL HIGH (ref 11.5–14.5)
WBC: 16.9 10*3/uL — AB (ref 3.8–10.6)
nRBC: 1 /100 WBC — ABNORMAL HIGH

## 2016-09-19 LAB — PATHOLOGIST SMEAR REVIEW

## 2016-09-19 LAB — COMPREHENSIVE METABOLIC PANEL
ALK PHOS: 421 U/L — AB (ref 38–126)
ALT: 136 U/L — AB (ref 17–63)
AST: 209 U/L — AB (ref 15–41)
Albumin: 2.5 g/dL — ABNORMAL LOW (ref 3.5–5.0)
Anion gap: 15 (ref 5–15)
BUN: 23 mg/dL — AB (ref 6–20)
CHLORIDE: 89 mmol/L — AB (ref 101–111)
CO2: 18 mmol/L — AB (ref 22–32)
CREATININE: 0.5 mg/dL — AB (ref 0.61–1.24)
Calcium: 7.4 mg/dL — ABNORMAL LOW (ref 8.9–10.3)
GFR calc Af Amer: >60 mL/min (ref >60)
GFR calc non Af Amer: >60 mL/min (ref >60)
Glucose, Bld: 70 mg/dL (ref 65–99)
Potassium: 5.1 mmol/L (ref 3.5–5.1)
SODIUM: 122 mmol/L — AB (ref 135–145)
Total Bilirubin: 15.9 mg/dL — ABNORMAL HIGH (ref 0.3–1.2)
Total Protein: 5.9 g/dL — ABNORMAL LOW (ref 6.5–8.1)

## 2016-09-19 LAB — PROTIME-INR
INR: 2.94
Prothrombin Time: 30.4 seconds — ABNORMAL HIGH (ref 11.4–15.2)

## 2016-09-19 LAB — AMMONIA: Ammonia: 45 umol/L — ABNORMAL HIGH (ref 9–35)

## 2016-09-19 LAB — APTT: aPTT: 40 seconds — ABNORMAL HIGH (ref 24–36)

## 2016-09-19 LAB — TROPONIN I: TROPONIN I: 0.08 ng/mL — AB (ref <0.03)

## 2016-09-19 MED ORDER — ALPRAZOLAM 0.5 MG PO TABS
0.5000 mg | ORAL_TABLET | Freq: Three times a day (TID) | ORAL | Status: DC | PRN
  Administered 2016-09-19: 08:00:00 0.5 mg via ORAL
  Filled 2016-09-19: qty 1

## 2016-09-19 MED ORDER — OLANZAPINE 10 MG PO TABS
10.0000 mg | ORAL_TABLET | Freq: Every day | ORAL | Status: DC
  Filled 2016-09-19: qty 1

## 2016-09-19 MED ORDER — ACETAMINOPHEN 325 MG PO TABS: 650.0000 mg | ORAL_TABLET | Freq: Four times a day (QID) | ORAL | Status: DC | PRN

## 2016-09-19 MED ORDER — ONDANSETRON HCL 4 MG PO TABS: 4.0000 mg | ORAL_TABLET | Freq: Four times a day (QID) | ORAL | Status: DC | PRN

## 2016-09-19 MED ORDER — MORPHINE SULFATE (CONCENTRATE) 10 MG/0.5ML PO SOLN
10.0000 mg | ORAL | Status: DC | PRN
  Administered 2016-09-19: 10 mg via ORAL
  Filled 2016-09-19: qty 1

## 2016-09-19 MED ORDER — LORAZEPAM 2 MG/ML IJ SOLN
2.0000 mg | INTRAMUSCULAR | Status: DC | PRN
  Administered 2016-09-19: 2 mg via INTRAVENOUS

## 2016-09-19 MED ORDER — LACTULOSE 10 GM/15ML PO SOLN
30.0000 g | Freq: Three times a day (TID) | ORAL | Status: DC
  Administered 2016-09-19: 08:00:00 30 g via ORAL
  Filled 2016-09-19: qty 60

## 2016-09-19 MED ORDER — MORPHINE SULFATE ER 15 MG PO TBCR
15.0000 mg | EXTENDED_RELEASE_TABLET | Freq: Two times a day (BID) | ORAL | Status: DC
  Administered 2016-09-19: 08:00:00 15 mg via ORAL
  Filled 2016-09-19: qty 1

## 2016-09-19 MED ORDER — ALPRAZOLAM 0.5 MG PO TABS: 0.5000 mg | ORAL_TABLET | Freq: Three times a day (TID) | ORAL | Status: DC

## 2016-09-19 MED ORDER — ACETAMINOPHEN 650 MG RE SUPP: 650.0000 mg | Freq: Four times a day (QID) | RECTAL | Status: DC | PRN

## 2016-09-19 MED ORDER — HEPARIN SOD (PORK) LOCK FLUSH 100 UNIT/ML IV SOLN
500.0000 [IU] | Freq: Once | INTRAVENOUS | Status: AC
  Administered 2016-09-19: 500 [IU] via INTRAVENOUS
  Filled 2016-09-19: qty 5

## 2016-09-19 MED ORDER — MORPHINE SULFATE (CONCENTRATE) 10 MG /0.5 ML PO SOLN: 10.0000 mg | ORAL | 0 refills | Status: AC | PRN

## 2016-09-19 MED ORDER — ONDANSETRON HCL 4 MG/2ML IJ SOLN: 4.0000 mg | Freq: Four times a day (QID) | INTRAMUSCULAR | Status: DC | PRN

## 2016-09-19 MED ORDER — SENNOSIDES-DOCUSATE SODIUM 8.6-50 MG PO TABS: 1.0000 | ORAL_TABLET | Freq: Every evening | ORAL | Status: DC | PRN

## 2016-09-19 MED ORDER — LORAZEPAM 2 MG/ML IJ SOLN
INTRAMUSCULAR | Status: AC
  Filled 2016-09-19: qty 1

## 2016-09-19 MED ORDER — SODIUM CHLORIDE 0.9 % IV SOLN
INTRAVENOUS | Status: DC
  Administered 2016-09-19: 07:00:00 via INTRAVENOUS

## 2016-09-19 MED ORDER — MORPHINE SULFATE (CONCENTRATE) 10 MG /0.5 ML PO SOLN: 10.0000 mg | ORAL | Status: DC | PRN

## 2016-09-19 MED ORDER — SODIUM CHLORIDE 0.9 % IV BOLUS (SEPSIS)
1000.0000 mL | Freq: Once | INTRAVENOUS | Status: AC
  Administered 2016-09-19: 1000 mL via INTRAVENOUS

## 2016-09-19 MED ORDER — FLUTICASONE PROPIONATE 50 MCG/ACT NA SUSP
2.0000 | Freq: Every day | NASAL | Status: DC
  Filled 2016-09-19: qty 16

## 2016-09-19 NOTE — ED Notes (Signed)
Patient transported to CT 

## 2016-09-19 NOTE — Progress Notes (Signed)
Visit made. Patient is currently followed by hospice of [facility] at home with a diagnosis of Lung Ca. He is a DNR. He was sent to the [facility] ER via EMS following a fall at home. He has had increased weakness and very poor appetite and difficulty managing his medications at home. Patient seen sitting up in bed, nonproductive cough noted, patient appears restless and jaundice. Large hematoma over right eye from fall overnight. Head CT in the ED showed metasatic disease osseous lesions, no fracture or bleed.  Daughter Malachy Mood at bed side. Discussed his current condition and her concerns about his decline and returning home. She feels he is unsafe. She is agreeable to transfer t the hospice home. Discussed with attending Dr. Manuella Ghazi. Hospice team updated. Transfer approved by Hospice NP Georgeanne Nim. Hospital care team all aware. Writer to call report and notify EMS for transport. Thank you. Flo Shanks RN, BSN, [place] [facility] [place], [facility] [phone] c

## 2016-09-19 NOTE — ED Provider Notes (Signed)
[facility] Emergency Department Provider Note   ____________________________________________   First MD Initiated Contact with Patient 09/19/16 [phone]     (approximate)  I have reviewed the triage vital signs and the nursing notes.   HISTORY  Chief Complaint Fall    HPI Austin Bryan is a 66 y.o. male Who comes into the hospital today after having a fall. The patient was going to the bathroom. The patient is on hospice for terminal lung cancer. The patient told his family that he was walking to the bathroom and fell asleep but he told staff here that he became weak. The patient's sister found him he states around 12:30 and his daughter was called around 1:30. He reports that nothing hurts now but before he came he was hurting all over. The patient denies any chest pain or shortness of breath. The patient's daughter reports that he's been having some increasing confusion as well as what they assumed was narcolepsy. She is concerned about his ability to be at home at this time. She states that they had spoken to social work today about him falling at home. The patient is here today for evaluation.    Past Medical History:  Diagnosis Date  . Allergy   . Diabetes mellitus without complication ([place])   . Dyspnea   . Lung cancer ([place]) 03/2016   Chemo tx's.    Patient Active Problem List   Diagnosis Date Noted  . Encounter for antineoplastic chemotherapy 09/07/2016  . Elevated LFTs 09/07/2016  . Thrombocytopenia ([place]) 09/07/2016  . Bone metastases ([place]) 05/24/2016  . Neoplasm related pain 05/03/2016  . Metastatic lung cancer (metastasis from lung to other site) ([place]) 04/20/2016  . Goals of care, counseling/discussion 04/20/2016  . 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 Diabetes ([place]) 02/10/2009    Past Surgical History:  Procedure Laterality Date  . ADENOIDECTOMY    . COLONOSCOPY    . IR FLUORO GUIDE PORT INSERTION RIGHT  04/16/2016  . TONSILLECTOMY       Prior to Admission medications   Medication Sig Start Date End Date Taking? Authorizing Provider  morphine (MS CONTIN) 15 MG 12 hr tablet Take 1 tablet (15 mg total) by mouth every 12 (twelve) hours. 08/01/16  Yes Cammie Sickle, MD  Morphine Sulfate (MORPHINE CONCENTRATE) 10 mg / 0.5 ml concentrated solution Take by mouth every 4 (four) hours as needed for severe pain. 0.25 ml every 4 hours as needed (prescribed by Dr. Lew Dawes)   Yes [provider]  prochlorperazine (COMPAZINE) 10 MG tablet Take 1 tablet (10 mg total) by mouth every 6 (six) hours as needed (Nausea or vomiting). 08/17/16  Yes Sindy Guadeloupe, MD  chlorproMAZINE (THORAZINE) 25 MG tablet Take 1 tablet (25 mg total) by mouth every 6 (six) hours as needed. Patient not taking: Reported on 09/10/2016 05/03/16   Sindy Guadeloupe, MD  dexamethasone (DECADRON) 4 MG tablet Take 2 tabs two times a day the day before and the day after  chemo Patient not taking: Reported on 09/10/2016 09/06/16   Lequita Asal, MD  fexofenadine (ALLEGRA) 180 MG tablet Take 1 tablet (180 mg total) by mouth daily. Patient not taking: Reported on 09/19/2016 09/14/15   Tawni Millers, MD  fluticasone [facility]) 50 MCG/ACT nasal spray Place 2 sprays into both nostrils daily.    [provider]  lidocaine (LIDODERM) 5 % Place 1 patch onto the skin daily. Remove & Discard patch within 12 hours or  as directed by MD Patient not taking: Reported on 08/23/2016 04/20/16   Sindy Guadeloupe, MD  metFORMIN (GLUCOPHAGE) 500 MG tablet Take 1 tablet (500 mg total) by mouth 2 (two) times daily with a meal. Patient not taking: Reported on 09/19/2016 09/14/15   Tawni Millers, MD  OLANZapine (ZYPREXA) 10 MG tablet Take 1 tablet (10 mg total) by mouth at bedtime. Patient not taking: Reported on 09/19/2016 08/23/16   Sindy Guadeloupe, MD  ranitidine (ZANTAC) 150 MG tablet Take 1 tablet (150 mg total) by mouth 2 (two) times daily. Patient not taking: Reported on 09/19/2016  09/14/15   Tawni Millers, MD  senna (SENOKOT) 8.6 MG TABS tablet Take 2 tablets (17.2 mg total) by mouth at bedtime. Patient not taking: Reported on 09/10/2016 05/17/16   Sindy Guadeloupe, MD    Allergies Patient has no known allergies.  Family History  Problem Relation Age of Onset  . Cancer Mother   . Cancer Father     Social History Social History  Substance Use Topics  . Smoking status: Current Every Day Smoker    Packs/day: 1.00    Years: 40.00    Types: Cigarettes  . Smokeless tobacco: Never Used  . Alcohol use No    Review of Systems  Constitutional: No fever/chills Eyes: No visual changes. ENT: No sore throat. Cardiovascular: Denies chest pain. Respiratory: Denies shortness of breath. Gastrointestinal: No abdominal pain.  No nausea, no vomiting.  No diarrhea.  No constipation. Genitourinary: Negative for dysuria. Musculoskeletal: Negative for back pain. Skin: jaundice Neurological: headache.   ____________________________________________   PHYSICAL EXAM:  VITAL SIGNS: ED Triage Vitals  Enc Vitals Group     BP 09/19/16 0256 123/79     Pulse Rate 09/19/16 0256 85     Resp 09/19/16 0256 (!) 9     Temp --      Temp Source 09/19/16 0256 Oral     SpO2 09/19/16 0256 100 %     Weight 09/19/16 0257 145 lb (65.8 kg)     Height 09/19/16 0257 6\' 3"  (1.905 m)     Head Circumference --      Peak Flow --      Pain Score 09/19/16 0256 4     Pain Loc --      Pain Edu? --      Excl. in [place]? --     Constitutional: Alert and confused. Cachectic appearing and in no acute distress. Eyes: Conjunctivae are normal. PERRL. EOMI. Scleral icterus Head: superficial laceration over right I with some clotted blood . Nose: No congestion/rhinnorhea. Mouth/Throat: Mucous membranes are moist.  Oropharynx non-erythematous. Neck: No cervical spine tenderness to palpation. Cardiovascular: Normal rate, regular rhythm. Grossly normal heart sounds.  Good peripheral  circulation. Respiratory: Normal respiratory effort.  No retractions. Lungs CTAB. Gastrointestinal: Soft and nontender. No distention.positive bowel sounds Musculoskeletal: No lower extremity tenderness nor edema.   Neurologic:  Normal speech with some confused language Skin:  Skin is warm, dry superficial laceration over right eyebrow, clotted blood in wound and edges together. jaundice Psychiatric: Mood and affect are normal.   ____________________________________________   LABS (all labs ordered are listed, but only abnormal results are displayed)  Labs Reviewed  TROPONIN I - Abnormal; Notable for the following:       Result Value   Troponin I 0.08 (*)    All other components within normal limits  CBC WITH DIFFERENTIAL/PLATELET - Abnormal; Notable for the following:    WBC  16.9 (*)    RBC 3.24 (*)    Hemoglobin 11.9 (*)    HCT 34.7 (*)    MCV 107.1 (*)    MCH 36.7 (*)    RDW 18.2 (*)    Platelets 10 (*)    All other components within normal limits  COMPREHENSIVE METABOLIC PANEL - Abnormal; Notable for the following:    Sodium 122 (*)    Chloride 89 (*)    CO2 18 (*)    BUN 23 (*)    Creatinine, Ser 0.50 (*)    Calcium 7.4 (*)    Total Protein 5.9 (*)    Albumin 2.5 (*)    AST 209 (*)    ALT 136 (*)    Alkaline Phosphatase 421 (*)    Total Bilirubin 15.9 (*)    All other components within normal limits  AMMONIA - Abnormal; Notable for the following:    Ammonia 45 (*)    All other components within normal limits  PROTIME-INR - Abnormal; Notable for the following:    Prothrombin Time 30.4 (*)    All other components within normal limits  APTT - Abnormal; Notable for the following:    aPTT 40 (*)    All other components within normal limits   ____________________________________________  EKG  ED ECG REPORT I, Loney Hering, the attending physician, personally viewed and interpreted this ECG.   Date: 09/19/2016  EKG Time: 256  Rate: 85  Rhythm: normal  sinus rhythm  Axis: normal  Intervals:none  ST&T Change: none, anterior q waves  ____________________________________________  RADIOLOGY  Ct Head Wo Contrast  Result Date: 09/19/2016 CLINICAL DATA:  66 y/o M; of the floor with hematoma to the right eyebrow. Metastatic lung cancer. EXAM: CT HEAD WITHOUT CONTRAST CT MAXILLOFACIAL WITHOUT CONTRAST CT CERVICAL SPINE WITHOUT CONTRAST TECHNIQUE: Multidetector CT imaging of the head, cervical spine, and maxillofacial structures were performed using the standard protocol without intravenous contrast. Multiplanar CT image reconstructions of the cervical spine and maxillofacial structures were also generated. COMPARISON:  04/23/2016 MRI head.  04/10/2016 PET-CT. FINDINGS: CT HEAD FINDINGS Brain: There multiple small lucencies in white matter most conspicuous in bifrontal periventricular white matter which are new in comparison with the prior MRI of the brain. Stable brain parenchymal volume loss. No hydrocephalus, effacement of basilar cisterns, or extra-axial collection. Vascular: Mild calcific atherosclerosis of carotid siphons. No hyperdense vessel. Skull: Multiple lytic calvarial lesions compatible with osseous metastatic disease. No calvarial fracture. Other: None. CT MAXILLOFACIAL FINDINGS Osseous: No fracture or mandibular dislocation. No destructive process. Postsurgical changes of the anterior walls of maxillary sinuses bilaterally. Orbits: Negative. No traumatic or inflammatory finding. Sinuses: Clear. Soft tissues: Right superior and lateral periorbital soft tissue thickening compatible with contusion. CT CERVICAL SPINE FINDINGS Alignment: Straightening of cervical lordosis without listhesis. Skull base and vertebrae: No acute fracture or dislocation. Diffuse osseous metastatic disease. Soft tissues and spinal canal: No prevertebral fluid or swelling. No visible canal hematoma. Disc levels: Advanced cervical spondylosis with multilevel discogenic  degenerative changes. No high-grade bony canal stenosis. Upper chest: Paraseptal emphysema and lung apices. Other: Negative. IMPRESSION: 1. Right superior and lateral periorbital soft tissue thickening compatible with contusion. No traumatic finding of the orbital compartment. 2. Subcentimeter foci of hypoattenuation in the brain new from prior MRI, given history and short interval, the probable differential includes metastatic disease or small infarctions. 3. No acute facial fracture or mandibular dislocation. 4. No acute intracranial hemorrhage or displaced calvarial fracture. 5. No  acute fracture or dislocation of the cervical spine. 6. Diffuse osseous metastatic disease. Electronically Signed   By: Kristine Garbe M.D.   On: 09/19/2016 04:47   Ct Cervical Spine Wo Contrast  Result Date: 09/19/2016 CLINICAL DATA:  66 y/o M; of the floor with hematoma to the right eyebrow. Metastatic lung cancer. EXAM: CT HEAD WITHOUT CONTRAST CT MAXILLOFACIAL WITHOUT CONTRAST CT CERVICAL SPINE WITHOUT CONTRAST TECHNIQUE: Multidetector CT imaging of the head, cervical spine, and maxillofacial structures were performed using the standard protocol without intravenous contrast. Multiplanar CT image reconstructions of the cervical spine and maxillofacial structures were also generated. COMPARISON:  04/23/2016 MRI head.  04/10/2016 PET-CT. FINDINGS: CT HEAD FINDINGS Brain: There multiple small lucencies in white matter most conspicuous in bifrontal periventricular white matter which are new in comparison with the prior MRI of the brain. Stable brain parenchymal volume loss. No hydrocephalus, effacement of basilar cisterns, or extra-axial collection. Vascular: Mild calcific atherosclerosis of carotid siphons. No hyperdense vessel. Skull: Multiple lytic calvarial lesions compatible with osseous metastatic disease. No calvarial fracture. Other: None. CT MAXILLOFACIAL FINDINGS Osseous: No fracture or mandibular dislocation. No  destructive process. Postsurgical changes of the anterior walls of maxillary sinuses bilaterally. Orbits: Negative. No traumatic or inflammatory finding. Sinuses: Clear. Soft tissues: Right superior and lateral periorbital soft tissue thickening compatible with contusion. CT CERVICAL SPINE FINDINGS Alignment: Straightening of cervical lordosis without listhesis. Skull base and vertebrae: No acute fracture or dislocation. Diffuse osseous metastatic disease. Soft tissues and spinal canal: No prevertebral fluid or swelling. No visible canal hematoma. Disc levels: Advanced cervical spondylosis with multilevel discogenic degenerative changes. No high-grade bony canal stenosis. Upper chest: Paraseptal emphysema and lung apices. Other: Negative. IMPRESSION: 1. Right superior and lateral periorbital soft tissue thickening compatible with contusion. No traumatic finding of the orbital compartment. 2. Subcentimeter foci of hypoattenuation in the brain new from prior MRI, given history and short interval, the probable differential includes metastatic disease or small infarctions. 3. No acute facial fracture or mandibular dislocation. 4. No acute intracranial hemorrhage or displaced calvarial fracture. 5. No acute fracture or dislocation of the cervical spine. 6. Diffuse osseous metastatic disease. Electronically Signed   By: Kristine Garbe M.D.   On: 09/19/2016 04:47   Ct Maxillofacial Wo Contrast  Result Date: 09/19/2016 CLINICAL DATA:  66 y/o M; of the floor with hematoma to the right eyebrow. Metastatic lung cancer. EXAM: CT HEAD WITHOUT CONTRAST CT MAXILLOFACIAL WITHOUT CONTRAST CT CERVICAL SPINE WITHOUT CONTRAST TECHNIQUE: Multidetector CT imaging of the head, cervical spine, and maxillofacial structures were performed using the standard protocol without intravenous contrast. Multiplanar CT image reconstructions of the cervical spine and maxillofacial structures were also generated. COMPARISON:  04/23/2016  MRI head.  04/10/2016 PET-CT. FINDINGS: CT HEAD FINDINGS Brain: There multiple small lucencies in white matter most conspicuous in bifrontal periventricular white matter which are new in comparison with the prior MRI of the brain. Stable brain parenchymal volume loss. No hydrocephalus, effacement of basilar cisterns, or extra-axial collection. Vascular: Mild calcific atherosclerosis of carotid siphons. No hyperdense vessel. Skull: Multiple lytic calvarial lesions compatible with osseous metastatic disease. No calvarial fracture. Other: None. CT MAXILLOFACIAL FINDINGS Osseous: No fracture or mandibular dislocation. No destructive process. Postsurgical changes of the anterior walls of maxillary sinuses bilaterally. Orbits: Negative. No traumatic or inflammatory finding. Sinuses: Clear. Soft tissues: Right superior and lateral periorbital soft tissue thickening compatible with contusion. CT CERVICAL SPINE FINDINGS Alignment: Straightening of cervical lordosis without listhesis. Skull base and vertebrae: No acute fracture  or dislocation. Diffuse osseous metastatic disease. Soft tissues and spinal canal: No prevertebral fluid or swelling. No visible canal hematoma. Disc levels: Advanced cervical spondylosis with multilevel discogenic degenerative changes. No high-grade bony canal stenosis. Upper chest: Paraseptal emphysema and lung apices. Other: Negative. IMPRESSION: 1. Right superior and lateral periorbital soft tissue thickening compatible with contusion. No traumatic finding of the orbital compartment. 2. Subcentimeter foci of hypoattenuation in the brain new from prior MRI, given history and short interval, the probable differential includes metastatic disease or small infarctions. 3. No acute facial fracture or mandibular dislocation. 4. No acute intracranial hemorrhage or displaced calvarial fracture. 5. No acute fracture or dislocation of the cervical spine. 6. Diffuse osseous metastatic disease. Electronically  Signed   By: Kristine Garbe M.D.   On: 09/19/2016 04:47    ____________________________________________   PROCEDURES  Procedure(s) performed: None  Procedures  Critical Care performed: No  ____________________________________________   INITIAL IMPRESSION / ASSESSMENT AND PLAN / ED COURSE  Pertinent labs & imaging results that were available during my care of the patient were reviewed by me and considered in my medical decision making (see chart for details).  This is a 66 year old male who comes into the hospital today after a fall. The patient is on hospice with terminal lung cancer. I did receive some results from the patient's blood work stating that he had platelets of 10,000 and a bilirubin over 15. The patient also has some hyponatremia, hypochloremia and some acidosis. Although the patient is a DO NOT RESUSCITATE and on hospice the patient's daughter is concerned that he may not be able to care for himself at home. The patient is very cachectic and his bilirubin has elevated rapidly. The patient's ammonia is 45 but I will give the patient some normal saline and admit him to the hospitalist service for further evaluation.      ____________________________________________   FINAL CLINICAL IMPRESSION(S) / ED DIAGNOSES  Final diagnoses:  Fall, initial encounter  Injury of head, initial encounter  Jaundice  Hyponatremia  Thrombocytopenia (HCC)  Hepatic encephalopathy ([place])      NEW MEDICATIONS STARTED DURING THIS VISIT:  New Prescriptions   No medications on file     Note:  This document was prepared using Dragon voice recognition software and may include unintentional dictation errors.    Loney Hering, MD 09/19/16 [phone]

## 2016-09-19 NOTE — Discharge Instructions (Signed)
Hospice Introduction Hospice is a service that is designed to provide people who are terminally ill and their families with medical, spiritual, and psychological support. Its aim is to improve your quality of life by keeping you as alert and comfortable as possible. Who will be my providers when I begin hospice care? Hospice teams often include:  A nurse.  A doctor. The hospice doctor will be available for your care, but you can bring your regular doctor or nurse practitioner.  Social workers.  Religious leaders (such as a Clinical biochemist).  Trained volunteers.  What roles will providers play in my care? Hospice is performed by a team of health care professionals and volunteers who:  Help keep you comfortable: ? Hospice can be provided in your home or in a homelike setting. ? The hospice staff works with your family and friends to help meet your needs. ? You will enjoy the support of loved ones by receiving much of your basic care from family and friends.  Provide pain relief and manage your symptoms. The staff supply all necessary medicines and equipment.  Provide companionship when you are alone.  Allow you and your family to rest. They may do light housekeeping, prepare meals, and run errands.  Provide counseling. They will make sure your emotional, spiritual, and social needs and those of your family are being met.  Provide spiritual care: ? Spiritual care will be individualized to meet your needs and your family's needs. ? Spiritual care may involve:  Helping you look at what death means to you.  Helping you say goodbye to your family and friends.  Performing a specific religious ceremony or ritual.  When should hospice care begin? Most people who use hospice are believed to have fewer than 6 months to live.  Your family and health care providers can help you decide when hospice services should begin.  If your condition improves, you may discontinue the program.  What  should I consider before selecting a program? Most hospice programs are run by nonprofit, independent organizations. Some are affiliated with hospitals, nursing homes, or home health care agencies. Hospice programs can take place in the home or at a hospice center, hospital, or skilled nursing facility. When choosing a hospice program, ask the following questions:  What services are available to me?  What services will be offered to my loved ones?  How involved will my loved ones be?  How involved will my health care provider be?  Who makes up the hospice care team? How are they trained or screened?  How will my pain and symptoms be managed?  If my circumstances change, can the services be provided in a different setting, such as my home or in the hospital?  Is the program reviewed and licensed by the state or certified in some other way?  Where can I learn more about hospice? You can learn about existing hospice programs in your area from your health care providers. You can also read more about hospice online. The websites of the following organizations contain helpful information:  The [facility] and Palliative Care Organization [facility]).  The Hospice Association of America ([place]).  The [place].  The American Cancer Society (ACS).  Hospice Net.  This information is not intended to replace advice given to you by your health care provider. Make sure you discuss any questions you have with your health care provider. Document Released: 04/20/2003 Document Revised: 08/18/2015 Document Reviewed: 11/11/2012 Elsevier Interactive Patient Education  2017 [employer].

## 2016-09-19 NOTE — H&P (Signed)
[facility] NAME: Austin Bryan    MR#:  [id number]  DATE OF BIRTH:  [birth year]  DATE OF ADMISSION:  09/19/2016  PRIMARY CARE PHYSICIAN: Gennette Pac, FNP   REQUESTING/REFERRING PHYSICIAN:   CHIEF COMPLAINT:   Chief Complaint  Patient presents with  . Fall    HISTORY OF PRESENT ILLNESS: Austin Bryan  is a 66 y.o. male with a known history of diabetes mellitus, lung cancer metastatic, diabetes mellitus presented to the emergency room with fall. Patient has a walker at home but sometimes forgets to use the walker. He lost balance and fell. Has generalized weakness. He has a bruise over the left eye secondary to fall. He appears to be anxious and fidgety. Patient is on home hospice at home. He is DO NOT RESUSCITATE by CODE STATUS. Patient was evaluated in the emergency room sodium was 122 and platelet count 10,000. Patient also has elevated ammonia.patient has periods of confusion.Hospitalist service was consulted for further care.  PAST MEDICAL HISTORY:   Past Medical History:  Diagnosis Date  . Allergy   . Diabetes mellitus without complication ([place])   . Dyspnea   . Lung cancer ([place]) 03/2016   Chemo tx's.    PAST SURGICAL HISTORY: Past Surgical History:  Procedure Laterality Date  . ADENOIDECTOMY    . COLONOSCOPY    . IR FLUORO GUIDE PORT INSERTION RIGHT  04/16/2016  . TONSILLECTOMY      SOCIAL HISTORY:  Social History  Substance Use Topics  . Smoking status: Current Every Day Smoker    Packs/day: 1.00    Years: 40.00    Types: Cigarettes  . Smokeless tobacco: Never Used  . Alcohol use No    FAMILY HISTORY:  Family History  Problem Relation Age of Onset  . Cancer Mother   . Cancer Father     DRUG ALLERGIES: No Known Allergies  REVIEW OF SYSTEMS:   CONSTITUTIONAL: No fever, has weakness.  EYES: No blurred or double vision.  EARS, NOSE, AND THROAT: No tinnitus or ear pain.  RESPIRATORY: No  cough, shortness of breath, wheezing or hemoptysis.  CARDIOVASCULAR: No chest pain, orthopnea, edema.  GASTROINTESTINAL: No nausea, vomiting, diarrhea or abdominal pain.  GENITOURINARY: No dysuria, hematuria.  ENDOCRINE: No polyuria, nocturia,  HEMATOLOGY: No anemia, easy bruising or bleeding SKIN: No rash or lesion. MUSCULOSKELETAL: No joint pain or arthritis.   NEUROLOGIC: has confusion on and off PSYCHIATRY: No anxiety or depression.   MEDICATIONS AT HOME:  Prior to Admission medications   Medication Sig Start Date End Date Taking? Authorizing Provider  morphine (MS CONTIN) 15 MG 12 hr tablet Take 1 tablet (15 mg total) by mouth every 12 (twelve) hours. 08/01/16  Yes Cammie Sickle, MD  Morphine Sulfate (MORPHINE CONCENTRATE) 10 mg / 0.5 ml concentrated solution Take by mouth every 4 (four) hours as needed for severe pain. 0.25 ml every 4 hours as needed (prescribed by Dr. Lew Dawes)   Yes [provider]  prochlorperazine (COMPAZINE) 10 MG tablet Take 1 tablet (10 mg total) by mouth every 6 (six) hours as needed (Nausea or vomiting). 08/17/16  Yes Sindy Guadeloupe, MD  chlorproMAZINE (THORAZINE) 25 MG tablet Take 1 tablet (25 mg total) by mouth every 6 (six) hours as needed. Patient not taking: Reported on 09/10/2016 05/03/16   Sindy Guadeloupe, MD  dexamethasone (DECADRON) 4 MG tablet Take 2 tabs two times a day the day before and the day  after  chemo Patient not taking: Reported on 09/10/2016 09/06/16   Lequita Asal, MD  fexofenadine (ALLEGRA) 180 MG tablet Take 1 tablet (180 mg total) by mouth daily. Patient not taking: Reported on 09/19/2016 09/14/15   Tawni Millers, MD  fluticasone [facility]) 50 MCG/ACT nasal spray Place 2 sprays into both nostrils daily.    [provider]  lidocaine (LIDODERM) 5 % Place 1 patch onto the skin daily. Remove & Discard patch within 12 hours or as directed by MD Patient not taking: Reported on 08/23/2016 04/20/16   Sindy Guadeloupe, MD   metFORMIN (GLUCOPHAGE) 500 MG tablet Take 1 tablet (500 mg total) by mouth 2 (two) times daily with a meal. Patient not taking: Reported on 09/19/2016 09/14/15   Tawni Millers, MD  OLANZapine (ZYPREXA) 10 MG tablet Take 1 tablet (10 mg total) by mouth at bedtime. Patient not taking: Reported on 09/19/2016 08/23/16   Sindy Guadeloupe, MD  ranitidine (ZANTAC) 150 MG tablet Take 1 tablet (150 mg total) by mouth 2 (two) times daily. Patient not taking: Reported on 09/19/2016 09/14/15   Tawni Millers, MD  senna (SENOKOT) 8.6 MG TABS tablet Take 2 tablets (17.2 mg total) by mouth at bedtime. Patient not taking: Reported on 09/10/2016 05/17/16   Sindy Guadeloupe, MD      PHYSICAL EXAMINATION:   VITAL SIGNS: Blood pressure 124/76, pulse 89, temperature (!) 97.4 F (36.3 C), temperature source Oral, resp. rate (!) 9, height 6\' 3"  (1.905 m), weight 65.8 kg (145 lb), SpO2 100 %.  GENERAL:  66 y.o.-year-old cachectic male patient lying in the bed. EYES: Pupils equal, round, reactive to light and accommodation. No scleral icterus. Extraocular muscles intact. Bruise over right eye brow HEENT: Head : contusion right eye, normocephalic. Oropharynx dry and nasopharynx clear.  NECK:  Supple, no jugular venous distention. No thyroid enlargement, no tenderness.  LUNGS: Normal breath sounds bilaterally, no wheezing, rales,rhonchi or crepitation. No use of accessory muscles of respiration.  Has a porta cath right chest wall. CARDIOVASCULAR: S1, S2 normal. No murmurs, rubs, or gallops.  ABDOMEN: Soft, nontender, nondistended. Bowel sounds present. No organomegaly or mass.  EXTREMITIES: No pedal edema, cyanosis, or clubbing.  NEUROLOGIC: Cranial nerves II through XII are intact. Muscle strength 5/5 in all extremities. Sensation intact. Gait not checked.  PSYCHIATRIC: could not be assessed SKIN: No obvious rash, lesion, or ulcer.   LABORATORY PANEL:   CBC  Recent Labs Lab 09/19/16 0257  WBC 16.9*  HGB 11.9*  HCT  34.7*  PLT 10*  MCV 107.1*  MCH 36.7*  MCHC 34.3  RDW 18.2*  LYMPHSABS 0.8*  MONOABS 1.2*  EOSABS 0.0  BASOSABS 0.0   ------------------------------------------------------------------------------------------------------------------  Chemistries   Recent Labs Lab 09/19/16 0257  NA 122*  K 5.1  CL 89*  CO2 18*  GLUCOSE 70  BUN 23*  CREATININE 0.50*  CALCIUM 7.4*  AST 209*  ALT 136*  ALKPHOS 421*  BILITOT 15.9*   ------------------------------------------------------------------------------------------------------------------ estimated creatinine clearance is 85.7 mL/min (A) (by C-G formula based on SCr of 0.5 mg/dL (L)). ------------------------------------------------------------------------------------------------------------------ No results for input(s): TSH, T4TOTAL, T3FREE, THYROIDAB in the last 72 hours.  Invalid input(s): FREET3   Coagulation profile  Recent Labs Lab 09/19/16 0422  INR 2.94   ------------------------------------------------------------------------------------------------------------------- No results for input(s): DDIMER in the last 72 hours. -------------------------------------------------------------------------------------------------------------------  Cardiac Enzymes  Recent Labs Lab 09/19/16 0257  TROPONINI 0.08*   ------------------------------------------------------------------------------------------------------------------ Invalid input(s): POCBNP  ---------------------------------------------------------------------------------------------------------------  Urinalysis No results  found for: COLORURINE, APPEARANCEUR, LABSPEC, PHURINE, GLUCOSEU, HGBUR, BILIRUBINUR, KETONESUR, PROTEINUR, UROBILINOGEN, NITRITE, LEUKOCYTESUR   RADIOLOGY: Ct Head Wo Contrast  Result Date: 09/19/2016 CLINICAL DATA:  66 y/o M; of the floor with hematoma to the right eyebrow. Metastatic lung cancer. EXAM: CT HEAD WITHOUT CONTRAST CT  MAXILLOFACIAL WITHOUT CONTRAST CT CERVICAL SPINE WITHOUT CONTRAST TECHNIQUE: Multidetector CT imaging of the head, cervical spine, and maxillofacial structures were performed using the standard protocol without intravenous contrast. Multiplanar CT image reconstructions of the cervical spine and maxillofacial structures were also generated. COMPARISON:  04/23/2016 MRI head.  04/10/2016 PET-CT. FINDINGS: CT HEAD FINDINGS Brain: There multiple small lucencies in white matter most conspicuous in bifrontal periventricular white matter which are new in comparison with the prior MRI of the brain. Stable brain parenchymal volume loss. No hydrocephalus, effacement of basilar cisterns, or extra-axial collection. Vascular: Mild calcific atherosclerosis of carotid siphons. No hyperdense vessel. Skull: Multiple lytic calvarial lesions compatible with osseous metastatic disease. No calvarial fracture. Other: None. CT MAXILLOFACIAL FINDINGS Osseous: No fracture or mandibular dislocation. No destructive process. Postsurgical changes of the anterior walls of maxillary sinuses bilaterally. Orbits: Negative. No traumatic or inflammatory finding. Sinuses: Clear. Soft tissues: Right superior and lateral periorbital soft tissue thickening compatible with contusion. CT CERVICAL SPINE FINDINGS Alignment: Straightening of cervical lordosis without listhesis. Skull base and vertebrae: No acute fracture or dislocation. Diffuse osseous metastatic disease. Soft tissues and spinal canal: No prevertebral fluid or swelling. No visible canal hematoma. Disc levels: Advanced cervical spondylosis with multilevel discogenic degenerative changes. No high-grade bony canal stenosis. Upper chest: Paraseptal emphysema and lung apices. Other: Negative. IMPRESSION: 1. Right superior and lateral periorbital soft tissue thickening compatible with contusion. No traumatic finding of the orbital compartment. 2. Subcentimeter foci of hypoattenuation in the brain  new from prior MRI, given history and short interval, the probable differential includes metastatic disease or small infarctions. 3. No acute facial fracture or mandibular dislocation. 4. No acute intracranial hemorrhage or displaced calvarial fracture. 5. No acute fracture or dislocation of the cervical spine. 6. Diffuse osseous metastatic disease. Electronically Signed   By: Kristine Garbe M.D.   On: 09/19/2016 04:47   Ct Cervical Spine Wo Contrast  Result Date: 09/19/2016 CLINICAL DATA:  66 y/o M; of the floor with hematoma to the right eyebrow. Metastatic lung cancer. EXAM: CT HEAD WITHOUT CONTRAST CT MAXILLOFACIAL WITHOUT CONTRAST CT CERVICAL SPINE WITHOUT CONTRAST TECHNIQUE: Multidetector CT imaging of the head, cervical spine, and maxillofacial structures were performed using the standard protocol without intravenous contrast. Multiplanar CT image reconstructions of the cervical spine and maxillofacial structures were also generated. COMPARISON:  04/23/2016 MRI head.  04/10/2016 PET-CT. FINDINGS: CT HEAD FINDINGS Brain: There multiple small lucencies in white matter most conspicuous in bifrontal periventricular white matter which are new in comparison with the prior MRI of the brain. Stable brain parenchymal volume loss. No hydrocephalus, effacement of basilar cisterns, or extra-axial collection. Vascular: Mild calcific atherosclerosis of carotid siphons. No hyperdense vessel. Skull: Multiple lytic calvarial lesions compatible with osseous metastatic disease. No calvarial fracture. Other: None. CT MAXILLOFACIAL FINDINGS Osseous: No fracture or mandibular dislocation. No destructive process. Postsurgical changes of the anterior walls of maxillary sinuses bilaterally. Orbits: Negative. No traumatic or inflammatory finding. Sinuses: Clear. Soft tissues: Right superior and lateral periorbital soft tissue thickening compatible with contusion. CT CERVICAL SPINE FINDINGS Alignment: Straightening of  cervical lordosis without listhesis. Skull base and vertebrae: No acute fracture or dislocation. Diffuse osseous metastatic disease. Soft tissues and spinal canal:  No prevertebral fluid or swelling. No visible canal hematoma. Disc levels: Advanced cervical spondylosis with multilevel discogenic degenerative changes. No high-grade bony canal stenosis. Upper chest: Paraseptal emphysema and lung apices. Other: Negative. IMPRESSION: 1. Right superior and lateral periorbital soft tissue thickening compatible with contusion. No traumatic finding of the orbital compartment. 2. Subcentimeter foci of hypoattenuation in the brain new from prior MRI, given history and short interval, the probable differential includes metastatic disease or small infarctions. 3. No acute facial fracture or mandibular dislocation. 4. No acute intracranial hemorrhage or displaced calvarial fracture. 5. No acute fracture or dislocation of the cervical spine. 6. Diffuse osseous metastatic disease. Electronically Signed   By: Kristine Garbe M.D.   On: 09/19/2016 04:47   Ct Maxillofacial Wo Contrast  Result Date: 09/19/2016 CLINICAL DATA:  66 y/o M; of the floor with hematoma to the right eyebrow. Metastatic lung cancer. EXAM: CT HEAD WITHOUT CONTRAST CT MAXILLOFACIAL WITHOUT CONTRAST CT CERVICAL SPINE WITHOUT CONTRAST TECHNIQUE: Multidetector CT imaging of the head, cervical spine, and maxillofacial structures were performed using the standard protocol without intravenous contrast. Multiplanar CT image reconstructions of the cervical spine and maxillofacial structures were also generated. COMPARISON:  04/23/2016 MRI head.  04/10/2016 PET-CT. FINDINGS: CT HEAD FINDINGS Brain: There multiple small lucencies in white matter most conspicuous in bifrontal periventricular white matter which are new in comparison with the prior MRI of the brain. Stable brain parenchymal volume loss. No hydrocephalus, effacement of basilar cisterns, or  extra-axial collection. Vascular: Mild calcific atherosclerosis of carotid siphons. No hyperdense vessel. Skull: Multiple lytic calvarial lesions compatible with osseous metastatic disease. No calvarial fracture. Other: None. CT MAXILLOFACIAL FINDINGS Osseous: No fracture or mandibular dislocation. No destructive process. Postsurgical changes of the anterior walls of maxillary sinuses bilaterally. Orbits: Negative. No traumatic or inflammatory finding. Sinuses: Clear. Soft tissues: Right superior and lateral periorbital soft tissue thickening compatible with contusion. CT CERVICAL SPINE FINDINGS Alignment: Straightening of cervical lordosis without listhesis. Skull base and vertebrae: No acute fracture or dislocation. Diffuse osseous metastatic disease. Soft tissues and spinal canal: No prevertebral fluid or swelling. No visible canal hematoma. Disc levels: Advanced cervical spondylosis with multilevel discogenic degenerative changes. No high-grade bony canal stenosis. Upper chest: Paraseptal emphysema and lung apices. Other: Negative. IMPRESSION: 1. Right superior and lateral periorbital soft tissue thickening compatible with contusion. No traumatic finding of the orbital compartment. 2. Subcentimeter foci of hypoattenuation in the brain new from prior MRI, given history and short interval, the probable differential includes metastatic disease or small infarctions. 3. No acute facial fracture or mandibular dislocation. 4. No acute intracranial hemorrhage or displaced calvarial fracture. 5. No acute fracture or dislocation of the cervical spine. 6. Diffuse osseous metastatic disease. Electronically Signed   By: Kristine Garbe M.D.   On: 09/19/2016 04:47    EKG: Orders placed or performed during the hospital encounter of 09/19/16  . ED EKG  . ED EKG    IMPRESSION AND PLAN: 66 year old male patient with metastatic lung cancer, diabetes mellitus presented to the emergency room with fall and  weakness.  Admitting diagnosis 1. Hyponatremia 2. Hyperammonemia 3. Accidental fall 4. Disorientation 5. Abnormal liver function tests 6.dehydration 7. Thrombocytopenia 8. Metastatic lung cancer Treatment plan Admit patient to medical floor IV fluid hydration Hospice evaluation Follow-up liver function tests Oral lactulose 3 times a day Follow-up ammonia level Case management evaluation for possible placement Family and able to take care of the patient at home  All the records are reviewed and case  discussed with ED provider. Management plans discussed with the patient, family and they are in agreement.  CODE STATUS:DNR Code Status History    This patient does not have a recorded code status. Please follow your organizational policy for patients in this situation.    Advance Directive Documentation     Most Recent Value  Type of Advance Directive  Out of facility DNR (pink MOST or yellow form)  Pre-existing out of facility DNR order (yellow form or pink MOST form)  Yellow form placed in chart (order not valid for inpatient use)  "MOST" Form in Place?  -       TOTAL TIME TAKING CARE OF THIS PATIENT: 56 minutes.    Saundra Shelling M.D on 09/19/2016 at 6:21 AM  Between 7am to 6pm - Pager - [phone]  After 6pm go to [email or web address] - password [email or web address]  Office  [phone]  CC: Primary care physician; Gennette Pac, [place]

## 2016-09-19 NOTE — Progress Notes (Signed)
EMS notified for transport, report called to the hospice home. Malachy Mood (daughter) at bedside and hospital care team all aware. Updated hospital notes faxed to the hospice home. Signed DNR in place in patient's discharge packet. Thank you. Flo Shanks RN, BSN, [facility] [place], [facility] [phone] c

## 2016-09-19 NOTE — ED Notes (Signed)
Assisted patient with the urinal 

## 2016-09-19 NOTE — ED Triage Notes (Signed)
Pt arrived via ems from home after sister found pt on floor. Pt denies loosing consciousness but reports being too weak to get up. Pt is alert and oriented x 4 and has hematoma to right eyebrow. Pt is on hospice care for lung ca.

## 2016-09-19 NOTE — Discharge Summary (Signed)
Austin Bryan at [facility] NAME: Austin Bryan    MR#:  [id number]  DATE OF BIRTH:  1950-07-23  DATE OF ADMISSION:  09/19/2016   ADMITTING PHYSICIAN: Austin Shelling, MD  DATE OF DISCHARGE: 09/19/2016  PRIMARY CARE PHYSICIAN: Austin Pac, FNP   ADMISSION DIAGNOSIS:  Hepatic encephalopathy ([place]) [K72.90] Hyponatremia [E87.1] Jaundice [R17] Thrombocytopenia ([place]) [D69.6] Injury of head, initial encounter [S09.90XA] Fall, initial encounter [W19.XXXA] DISCHARGE DIAGNOSIS:  Active Problems:   Hyponatremia  SECONDARY DIAGNOSIS:   Past Medical History:  Diagnosis Date  . Allergy   . Diabetes mellitus without complication ([place])   . Dyspnea   . Lung cancer ([place]) 03/2016   Chemo tx's.   HOSPITAL COURSE:  66 y.o. male with a known history of diabetes mellitus, lung cancer metastatic, diabetes mellitus admitted s/p fall.   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 Hyponatremia 2. Hyperammonemia 3. Accidental fall 4. Disorientation 5. Abnormal liver function tests 6.dehydration 7. Thrombocytopenia  After a long discussion with patient's daughter, Austin Bryan at bedside.  Decision was made to transfer him to hospice home.Austin Bryan from hospice is involved and aware.  They do have a bed available where he will be transferred later today.  DISCHARGE CONDITIONS:  critical CONSULTS OBTAINED:   DRUG ALLERGIES:  No Known Allergies DISCHARGE MEDICATIONS:   Allergies as of 09/19/2016   No Known Allergies     Medication List    STOP taking these medications   chlorproMAZINE 25 MG tablet Commonly known as:  THORAZINE   dexamethasone 4 MG tablet Commonly known as:  DECADRON   fexofenadine 180 MG tablet Commonly known as:  ALLEGRA   fluticasone 50 MCG/ACT nasal spray Commonly known as:  FLONASE   lidocaine 5 % Commonly known as:  LIDODERM   metFORMIN 500 MG tablet Commonly  known as:  GLUCOPHAGE   OLANZapine 10 MG tablet Commonly known as:  ZYPREXA   prochlorperazine 10 MG tablet Commonly known as:  COMPAZINE   ranitidine 150 MG tablet Commonly known as:  ZANTAC   senna 8.6 MG Tabs tablet Commonly known as:  SENOKOT     TAKE these medications   morphine 15 MG 12 hr tablet Commonly known as:  MS CONTIN Take 1 tablet (15 mg total) by mouth every 12 (twelve) hours. What changed:  Another medication with the same name was changed. Make sure you understand how and when to take each.   morphine CONCENTRATE 10 mg / 0.5 ml concentrated solution Take 0.5 mLs (10 mg total) by mouth every 2 (two) hours as needed for severe pain. 0.25 ml every 4 hours as needed (prescribed by Dr. Lew Bryan) What changed:  how much to take  when to take this            Discharge Care Instructions        Start     Ordered   09/19/16 0000  Morphine Sulfate (MORPHINE CONCENTRATE) 10 mg / 0.5 ml concentrated solution  Every 2 hours PRN     09/19/16 1022   09/19/16 0000  Increase activity slowly     09/19/16 1022   09/19/16 0000  Diet - low sodium heart healthy     09/19/16 1022     DISCHARGE INSTRUCTIONS:   He has had increased weakness and very poor appetite and difficulty managing his medications at home. Patient seen sitting up in bed, nonproductive cough noted, patient  appears restless and jaundice. Large hematoma over right eye from fall overnight. Head CT in the ED showed metasatic disease osseous lesions, no fracture or bleed.  [name]. Discussed his current condition and her concerns about his decline and returning home. She feels he is unsafe. She is agreeable to transfer t the hospice home. Discussed with attending Dr. Manuella Bryan. Hospice team updated. Transfer approved by Hospice NP Austin Bryan. Hospital care team all aware. Writer to call report and notify EMS for transport. Thank you. DIET:  Pleasure feed DISCHARGE CONDITION:   Critical ACTIVITY:  Activity as tolerated OXYGEN:  Home Oxygen: Yes.    Oxygen Delivery: 2 liters/min via Patient connected to nasal cannula oxygen DISCHARGE LOCATION:  Hospice Home   If you experience worsening of your admission symptoms, develop shortness of breath, life threatening emergency, suicidal or homicidal thoughts you must seek medical attention immediately by calling 911 or calling your MD immediately  if symptoms less severe.  You Must read complete instructions/literature along with all the possible adverse reactions/side effects for all the Medicines you take and that have been prescribed to you. Take any new Medicines after you have completely understood and accpet all the possible adverse reactions/side effects.   Please note  You were cared for by a hospitalist during your hospital stay. If you have any questions about your discharge medications or the care you received while you were in the hospital after you are discharged, you can call the unit and asked to speak with the hospitalist on call if the hospitalist that took care of you is not available. Once you are discharged, your primary care physician will handle any further medical issues. Please note that NO REFILLS for any discharge medications will be authorized once you are discharged, as it is imperative that you return to your primary care physician (or establish a relationship with a primary care physician if you do not have one) for your aftercare needs so that they can reassess your need for medications and monitor your lab values.    On the day of Discharge:  VITAL SIGNS:  Blood pressure 113/75, pulse 88, temperature 97.8 F (36.6 C), resp. rate 20, height 6\' 3"  (1.905 m), weight 66.8 kg (147 lb 4.8 oz), SpO2 90 %. PHYSICAL EXAMINATION:  GENERAL:  66 y.o.-year-old patient Patient seen sitting up in bed, nonproductive cough noted, patient appears restless and jaundiced.  EYES: Pupils equal, round, reactive  to light and accommodation. No scleral icterus. Extraocular muscles intact. Large hematoma over right eye from fall overnight. HEENT: Head atraumatic, normocephalic. Oropharynx and nasopharynx clear.  NECK:  Supple, no jugular venous distention. No thyroid enlargement, no tenderness.  LUNGS: Normal breath sounds bilaterally, no wheezing, rales,rhonchi or crepitation. No use of accessory muscles of respiration.  CARDIOVASCULAR: S1, S2 normal. No murmurs, rubs, or gallops.  ABDOMEN: Soft, non-tender, non-distended. Bowel sounds present. No organomegaly or mass.  EXTREMITIES: No pedal edema, cyanosis, or clubbing.  NEUROLOGIC: nonfocal, unresponsive PSYCHIATRIC: The patient is unresponsive so unable to assess SKIN: No obvious rash, lesion, or ulcer.  DATA REVIEW:   CBC  Recent Labs Lab 09/19/16 0257  WBC 16.9*  HGB 11.9*  HCT 34.7*  PLT 10*    Chemistries   Recent Labs Lab 09/19/16 0257  NA 122*  K 5.1  CL 89*  CO2 18*  GLUCOSE 70  BUN 23*  CREATININE 0.50*  CALCIUM 7.4*  AST 209*  ALT 136*  ALKPHOS 421*  BILITOT 15.9*  Microbiology Results  No results found for this or any previous visit.  RADIOLOGY:  Ct Head Wo Contrast  Result Date: 09/19/2016 CLINICAL DATA:  66 y/o M; of the floor with hematoma to the right eyebrow. Metastatic lung cancer. EXAM: CT HEAD WITHOUT CONTRAST CT MAXILLOFACIAL WITHOUT CONTRAST CT CERVICAL SPINE WITHOUT CONTRAST TECHNIQUE: Multidetector CT imaging of the head, cervical spine, and maxillofacial structures were performed using the standard protocol without intravenous contrast. Multiplanar CT image reconstructions of the cervical spine and maxillofacial structures were also generated. COMPARISON:  04/23/2016 MRI head.  04/10/2016 PET-CT. FINDINGS: CT HEAD FINDINGS Brain: There multiple small lucencies in white matter most conspicuous in bifrontal periventricular white matter which are new in comparison with the prior MRI of the brain. Stable  brain parenchymal volume loss. No hydrocephalus, effacement of basilar cisterns, or extra-axial collection. Vascular: Mild calcific atherosclerosis of carotid siphons. No hyperdense vessel. Skull: Multiple lytic calvarial lesions compatible with osseous metastatic disease. No calvarial fracture. Other: None. CT MAXILLOFACIAL FINDINGS Osseous: No fracture or mandibular dislocation. No destructive process. Postsurgical changes of the anterior walls of maxillary sinuses bilaterally. Orbits: Negative. No traumatic or inflammatory finding. Sinuses: Clear. Soft tissues: Right superior and lateral periorbital soft tissue thickening compatible with contusion. CT CERVICAL SPINE FINDINGS Alignment: Straightening of cervical lordosis without listhesis. Skull base and vertebrae: No acute fracture or dislocation. Diffuse osseous metastatic disease. Soft tissues and spinal canal: No prevertebral fluid or swelling. No visible canal hematoma. Disc levels: Advanced cervical spondylosis with multilevel discogenic degenerative changes. No high-grade bony canal stenosis. Upper chest: Paraseptal emphysema and lung apices. Other: Negative. IMPRESSION: 1. Right superior and lateral periorbital soft tissue thickening compatible with contusion. No traumatic finding of the orbital compartment. 2. Subcentimeter foci of hypoattenuation in the brain new from prior MRI, given history and short interval, the probable differential includes metastatic disease or small infarctions. 3. No acute facial fracture or mandibular dislocation. 4. No acute intracranial hemorrhage or displaced calvarial fracture. 5. No acute fracture or dislocation of the cervical spine. 6. Diffuse osseous metastatic disease. Electronically Signed   By: Kristine Garbe M.D.   On: 09/19/2016 04:47   Ct Cervical Spine Wo Contrast  Result Date: 09/19/2016 CLINICAL DATA:  66 y/o M; of the floor with hematoma to the right eyebrow. Metastatic lung cancer. EXAM: CT HEAD  WITHOUT CONTRAST CT MAXILLOFACIAL WITHOUT CONTRAST CT CERVICAL SPINE WITHOUT CONTRAST TECHNIQUE: Multidetector CT imaging of the head, cervical spine, and maxillofacial structures were performed using the standard protocol without intravenous contrast. Multiplanar CT image reconstructions of the cervical spine and maxillofacial structures were also generated. COMPARISON:  04/23/2016 MRI head.  04/10/2016 PET-CT. FINDINGS: CT HEAD FINDINGS Brain: There multiple small lucencies in white matter most conspicuous in bifrontal periventricular white matter which are new in comparison with the prior MRI of the brain. Stable brain parenchymal volume loss. No hydrocephalus, effacement of basilar cisterns, or extra-axial collection. Vascular: Mild calcific atherosclerosis of carotid siphons. No hyperdense vessel. Skull: Multiple lytic calvarial lesions compatible with osseous metastatic disease. No calvarial fracture. Other: None. CT MAXILLOFACIAL FINDINGS Osseous: No fracture or mandibular dislocation. No destructive process. Postsurgical changes of the anterior walls of maxillary sinuses bilaterally. Orbits: Negative. No traumatic or inflammatory finding. Sinuses: Clear. Soft tissues: Right superior and lateral periorbital soft tissue thickening compatible with contusion. CT CERVICAL SPINE FINDINGS Alignment: Straightening of cervical lordosis without listhesis. Skull base and vertebrae: No acute fracture or dislocation. Diffuse osseous metastatic disease. Soft tissues and spinal canal: No prevertebral fluid  or swelling. No visible canal hematoma. Disc levels: Advanced cervical spondylosis with multilevel discogenic degenerative changes. No high-grade bony canal stenosis. Upper chest: Paraseptal emphysema and lung apices. Other: Negative. IMPRESSION: 1. Right superior and lateral periorbital soft tissue thickening compatible with contusion. No traumatic finding of the orbital compartment. 2. Subcentimeter foci of  hypoattenuation in the brain new from prior MRI, given history and short interval, the probable differential includes metastatic disease or small infarctions. 3. No acute facial fracture or mandibular dislocation. 4. No acute intracranial hemorrhage or displaced calvarial fracture. 5. No acute fracture or dislocation of the cervical spine. 6. Diffuse osseous metastatic disease. Electronically Signed   By: Kristine Garbe M.D.   On: 09/19/2016 04:47   Ct Maxillofacial Wo Contrast  Result Date: 09/19/2016 CLINICAL DATA:  66 y/o M; of the floor with hematoma to the right eyebrow. Metastatic lung cancer. EXAM: CT HEAD WITHOUT CONTRAST CT MAXILLOFACIAL WITHOUT CONTRAST CT CERVICAL SPINE WITHOUT CONTRAST TECHNIQUE: Multidetector CT imaging of the head, cervical spine, and maxillofacial structures were performed using the standard protocol without intravenous contrast. Multiplanar CT image reconstructions of the cervical spine and maxillofacial structures were also generated. COMPARISON:  04/23/2016 MRI head.  04/10/2016 PET-CT. FINDINGS: CT HEAD FINDINGS Brain: There multiple small lucencies in white matter most conspicuous in bifrontal periventricular white matter which are new in comparison with the prior MRI of the brain. Stable brain parenchymal volume loss. No hydrocephalus, effacement of basilar cisterns, or extra-axial collection. Vascular: Mild calcific atherosclerosis of carotid siphons. No hyperdense vessel. Skull: Multiple lytic calvarial lesions compatible with osseous metastatic disease. No calvarial fracture. Other: None. CT MAXILLOFACIAL FINDINGS Osseous: No fracture or mandibular dislocation. No destructive process. Postsurgical changes of the anterior walls of maxillary sinuses bilaterally. Orbits: Negative. No traumatic or inflammatory finding. Sinuses: Clear. Soft tissues: Right superior and lateral periorbital soft tissue thickening compatible with contusion. CT CERVICAL SPINE FINDINGS  Alignment: Straightening of cervical lordosis without listhesis. Skull base and vertebrae: No acute fracture or dislocation. Diffuse osseous metastatic disease. Soft tissues and spinal canal: No prevertebral fluid or swelling. No visible canal hematoma. Disc levels: Advanced cervical spondylosis with multilevel discogenic degenerative changes. No high-grade bony canal stenosis. Upper chest: Paraseptal emphysema and lung apices. Other: Negative. IMPRESSION: 1. Right superior and lateral periorbital soft tissue thickening compatible with contusion. No traumatic finding of the orbital compartment. 2. Subcentimeter foci of hypoattenuation in the brain new from prior MRI, given history and short interval, the probable differential includes metastatic disease or small infarctions. 3. No acute facial fracture or mandibular dislocation. 4. No acute intracranial hemorrhage or displaced calvarial fracture. 5. No acute fracture or dislocation of the cervical spine. 6. Diffuse osseous metastatic disease. Electronically Signed   By: Kristine Garbe M.D.   On: 09/19/2016 04:47     Management plans discussed with the patient, family and they are in agreement.  CODE STATUS: DNR /comfort care  TOTAL TIME TAKING CARE OF THIS PATIENT: 45 minutes.    Max Sane M.D on 09/19/2016 at 10:56 AM  Between 7am to 6pm - Pager - [phone]  After 6pm go to [email or web address] - Proofreader  [facility]  Office  [phone]  CC: Primary care physician; Austin Pac, FNP   Note: This dictation was prepared with Dragon dictation along with smaller phrase technology. Any transcriptional errors that result from this process are unintentional.

## 2016-09-19 NOTE — Progress Notes (Signed)
Per [facility] hospice liaison patient is a current hospice patient from home and will go to the hospice facility today. MD is aware of above. Clinical Education officer, museum (CSW) prepared EMS packet including DNR. Please reconsult if future social work needs arise. CSW signing off.   [employer], LCSW [phone]

## 2016-09-19 NOTE — ED Notes (Addendum)
Lab called this RN for critical troponin on pt, this RN notified pt's primary RN who notified EDP with this RN.

## 2016-09-19 NOTE — Progress Notes (Signed)
Patient's PAC flushed with saline, then heparin, and de-accessed.  Patient sleeping through care, daughter at bedside.  Patient transported to hospice via EMS.  Clarise Cruz, RN

## 2016-10-15 DEATH — deceased

## 2016-10-26 ENCOUNTER — Other Ambulatory Visit: Payer: Self-pay | Admitting: Nurse Practitioner

## 2018-09-06 IMAGING — CT CT NECK W/ CM
4 of 5 series · 15 of 33 positions shown, 17 images · IV contrast (iopamidol)
Comparison: None.

CLINICAL DATA: Hoarseness for 1 month.  Left vocal cord paralysis.

EXAM:
CT NECK WITH CONTRAST
TECHNIQUE: Multidetector CT imaging of the neck was performed using the
standard protocol following the bolus administration of intravenous
contrast.
CONTRAST:  75mL EUC30L-DII IOPAMIDOL (EUC30L-DII) INJECTION 61%

[Series 2: axial neck · axial · 0.58mm/px · z∈[+217,+353]mm · 3 of 135 slices shown]
[im 34/135  bone]
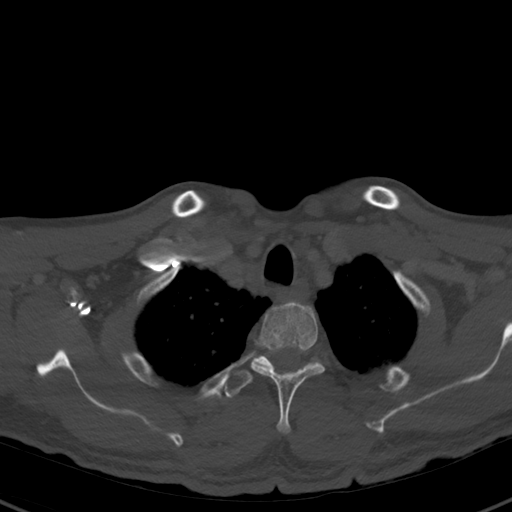
[im 68/135  bone]
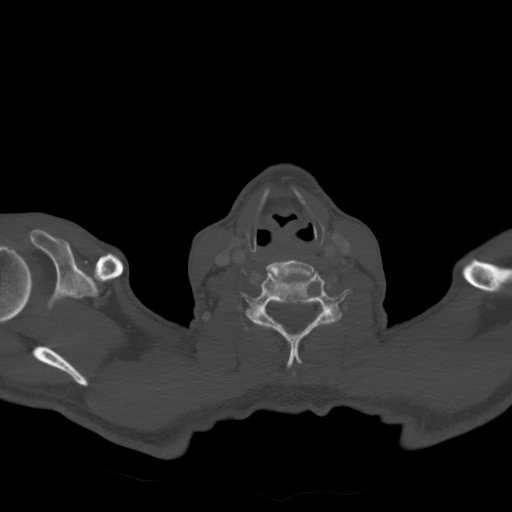
[im 101/135  bone]
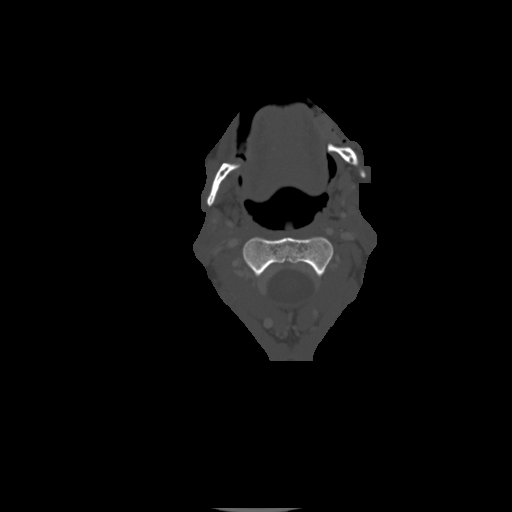

[Series 6: sag neck · sagittal · 0.59mm/px · 5 of 112 slices shown, 6 images]
[im 38/112  bone]
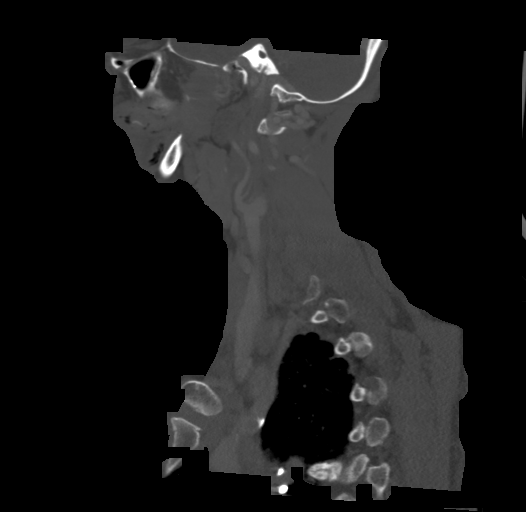
[im 47/112  bone]
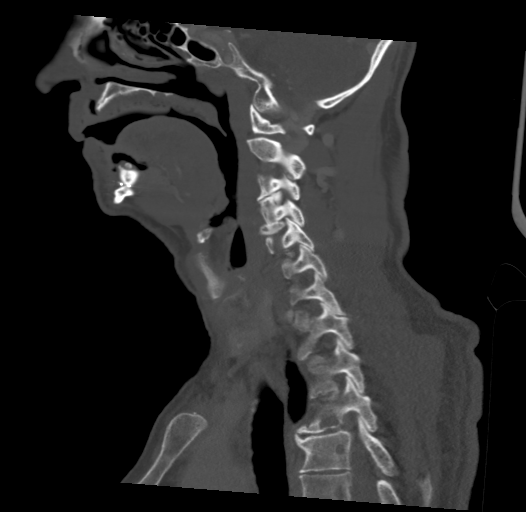
[im 56/112  soft-tissue]
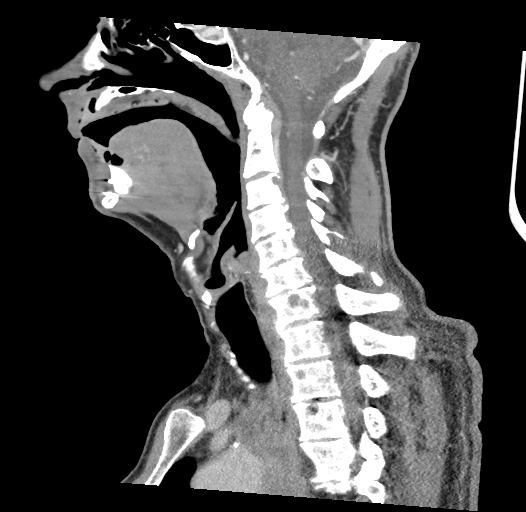
[im 56/112  bone]
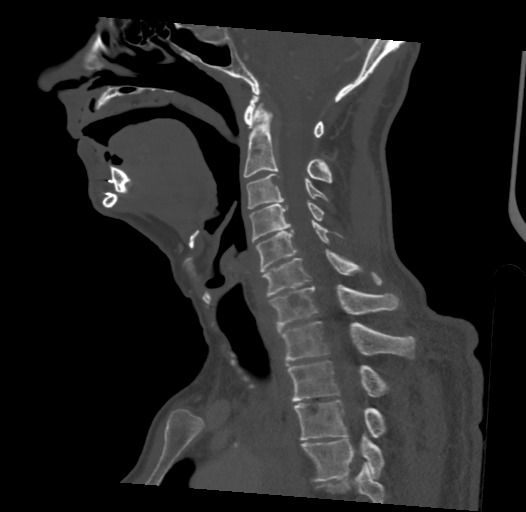
[im 65/112  bone]
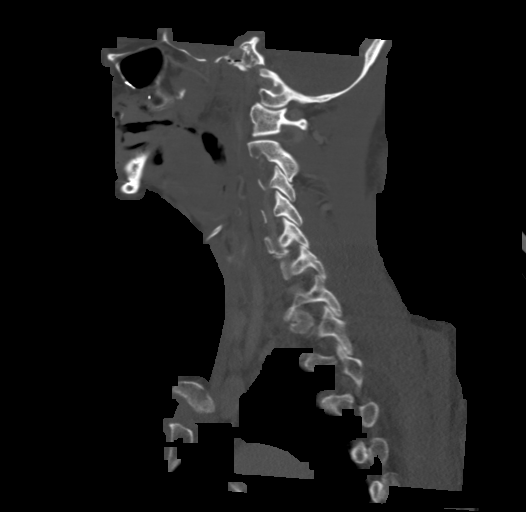
[im 75/112  bone]
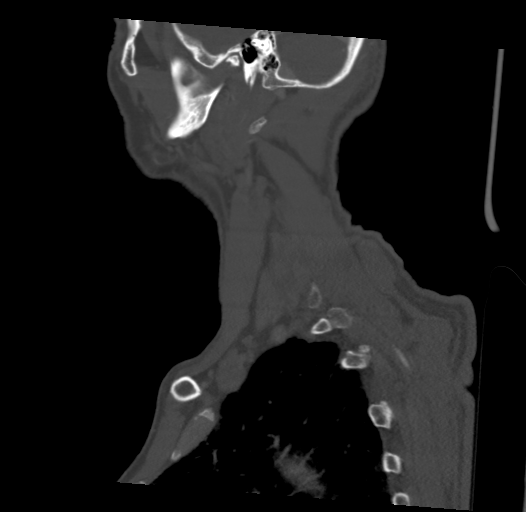

[Series 7: cor neck · coronal · 0.50mm/px · 3 of 114 slices shown]
[im 23/114  bone]
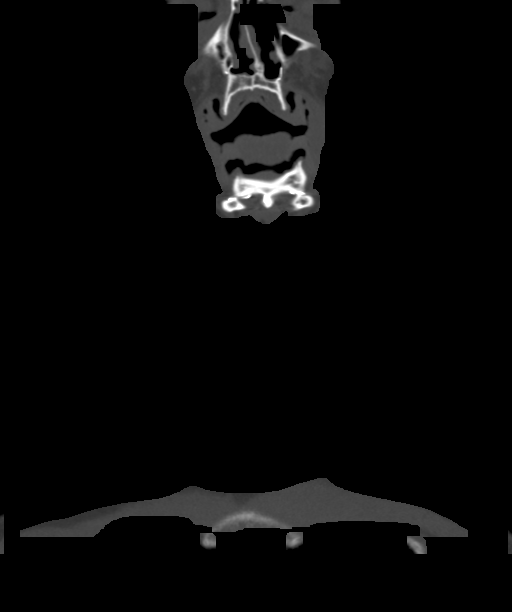
[im 46/114  bone]
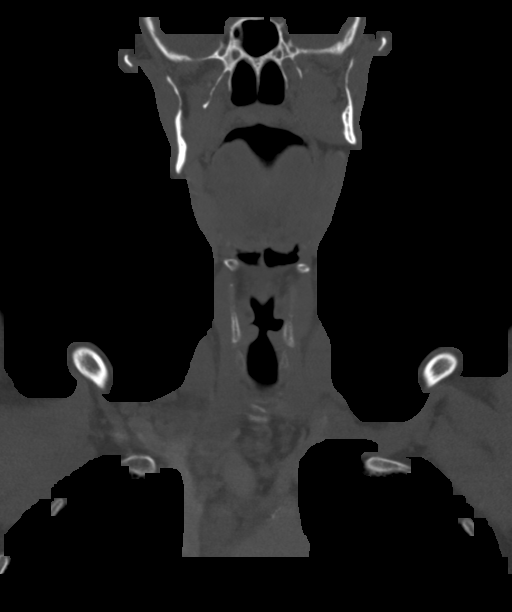
[im 68/114  bone]
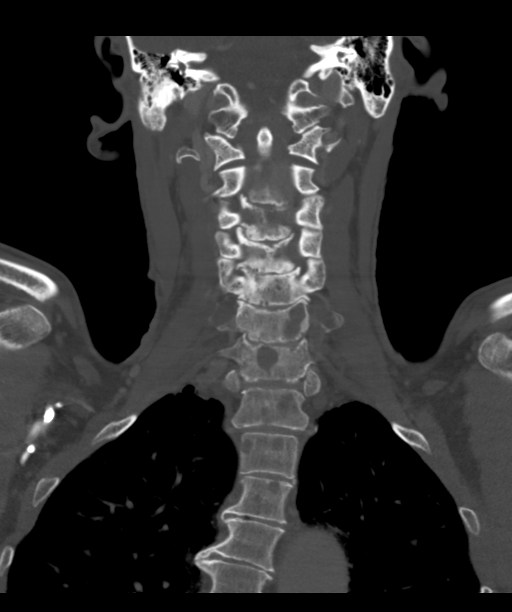

[Series 8: orthogonal ax · axial · 0.50mm/px · z∈[+160,+349]mm · 4 of 158 slices shown, 5 images]
[im 32/158  soft-tissue]
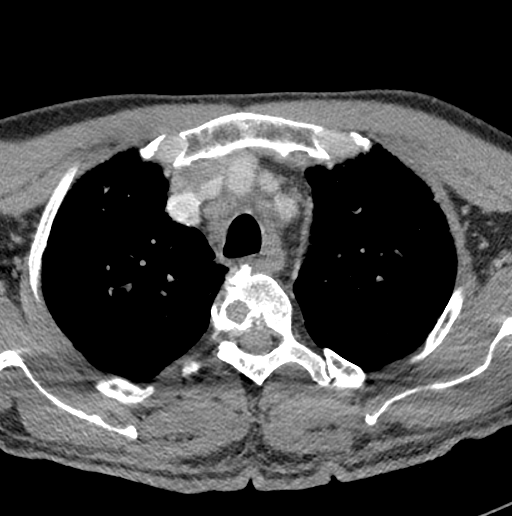
[im 32/158  bone]
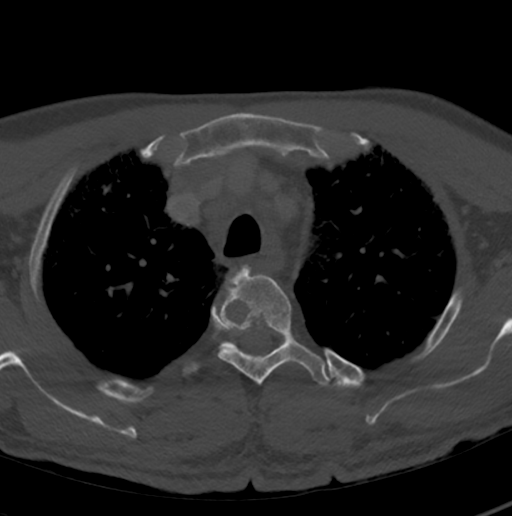
[im 63/158  bone]
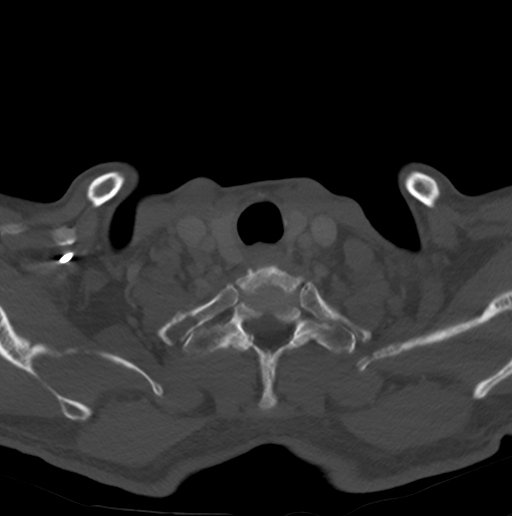
[im 95/158  bone]
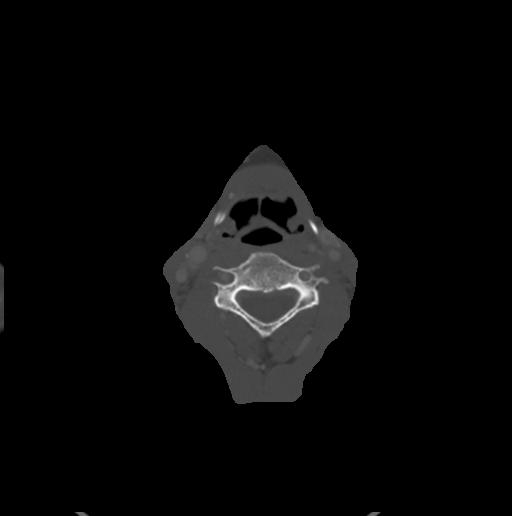
[im 126/158  bone]
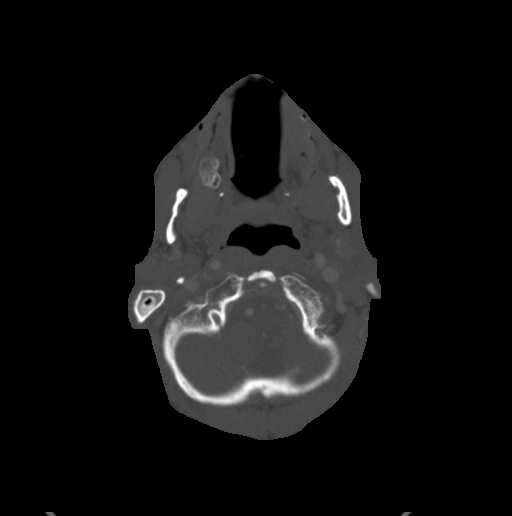

[15 of 33 positions shown; findings below may reference images not displayed]

FINDINGS: Pharynx and larynx: The left true vocal cord is displaced to the
left with asymmetry of the laryngeal ventricle compatible with vocal
cord paralysis. No focal mucosal or submucosal lesions are present.
The nasopharynx, oropharynx, and hypopharynx are otherwise within
normal limits.

Salivary glands: The parotid and submandibular glands are within
normal limits bilaterally.

Thyroid: Negative

Lymph nodes: No significant cervical adenopathy is present.
Prominent rounded lymph nodes are present at the thoracic inlet
bilaterally.

Vascular: Atherosclerotic calcifications are present at the aortic
arch and carotid bifurcations bilaterally without significant
stenosis.

Limited intracranial: Unremarkable.

Visualized orbits: Within normal limits.

Mastoids and visualized paranasal sinuses: Wall thickening is
present in the right maxillary sinus. Posttraumatic changes are
noted to the anterior maxilla bilaterally. There is no active sinus
disease. The mastoid air cells are clear.

Skeleton: Multiple lytic lesions are present throughout the cervical
and upper thoracic spine to the lowest imaged level, T4. There is no
pathologic collapse. Prominent uncovertebral and endplate
degenerative changes are present at C3-4, C4-5, and C5-6 most
notably. Additional lytic lesions are noted within the superior
aspect of the sternum and posterior left second and third ribs.
Lytic lesions are present in the posterior right first and fifth
ribs.

Upper chest: Extensive mediastinal adenopathy is present. A right
peritracheal nodal mass measures 16 mm in short axis. There is
extensive soft tissue along undersurface of the aortic arch, likely
nodal metastases compromising the left recurrent laryngeal nerve.

A 1.5 cm short axis low-density right supraclavicular lymph node is
present near the sternoclavicular junction on image 100.

Paraseptal emphysematous changes are present bilaterally. A 12 mm
pleural-based nodule is seen posteriorly in the left upper lobe. No
other focal nodule or mass lesion is present.
IMPRESSION: 1. Soft tissue mass along the undersurface of the aortic arch is
most concerning for a metastatic adenopathy. This likely impacts the
left recurrent laryngeal nerve leading to vocal cord paralysis.
2. Extensive mediastinal adenopathy compatible with metastatic
disease.
3. Numerous lytic lesions throughout the visualized is cervical and
thoracic spine as well is bilateral ribs. These are compatible with
additional metastases.
4. 12 mm pleural-based nodule in the left upper lid may represent a
primary bronchogenic neoplasm. This is more likely metastatic
disease.
5. Findings are most concerning for a primary lung cancer. Recommend
CT of the chest, abdomen, and pelvis with contrast for further
evaluation of the primary neoplasm and extent of metastatic disease.
These results were called by telephone at the time of interpretation
on 04/02/2016 at [DATE] to Dr. NATALY TUYA , who verbally
acknowledged these results.
